# Patient Record
Sex: Female | Born: 1935 | Race: White | Hispanic: No | Marital: Single | Smoking: Never smoker
Health system: Southern US, Community
[De-identification: ages and names within clinical notes are randomized; demographics above are authoritative.]

## PROBLEM LIST (undated history)

## (undated) ENCOUNTER — Emergency Department (HOSPITAL_COMMUNITY): Discharge status: Absent without leave | Payer: Self-pay

## (undated) DIAGNOSIS — F0280 Dementia in other diseases classified elsewhere without behavioral disturbance: Secondary | ICD-10-CM

## (undated) DIAGNOSIS — N390 Urinary tract infection, site not specified: Secondary | ICD-10-CM

## (undated) DIAGNOSIS — D649 Anemia, unspecified: Secondary | ICD-10-CM

## (undated) DIAGNOSIS — N289 Disorder of kidney and ureter, unspecified: Secondary | ICD-10-CM

## (undated) DIAGNOSIS — G309 Alzheimer's disease, unspecified: Secondary | ICD-10-CM

## (undated) DIAGNOSIS — I10 Essential (primary) hypertension: Secondary | ICD-10-CM

## (undated) DIAGNOSIS — R1310 Dysphagia, unspecified: Secondary | ICD-10-CM

## (undated) DIAGNOSIS — K219 Gastro-esophageal reflux disease without esophagitis: Secondary | ICD-10-CM

## (undated) DIAGNOSIS — E119 Type 2 diabetes mellitus without complications: Secondary | ICD-10-CM

## (undated) DIAGNOSIS — M1990 Unspecified osteoarthritis, unspecified site: Secondary | ICD-10-CM

## (undated) DIAGNOSIS — F319 Bipolar disorder, unspecified: Secondary | ICD-10-CM

## (undated) HISTORY — PX: CHOLECYSTECTOMY: SHX55

---

## 2002-03-23 ENCOUNTER — Other Ambulatory Visit: Admission: RE | Admit: 2002-03-23 | Discharge: 2002-03-23 | Payer: Self-pay | Admitting: Obstetrics & Gynecology

## 2013-02-23 ENCOUNTER — Other Ambulatory Visit: Payer: Self-pay | Admitting: Geriatric Medicine

## 2013-02-23 MED ORDER — LORAZEPAM 0.5 MG PO TABS: 0.5000 mg | ORAL_TABLET | Freq: Two times a day (BID) | ORAL | Status: DC

## 2013-02-27 ENCOUNTER — Other Ambulatory Visit: Payer: Self-pay | Admitting: Geriatric Medicine

## 2013-02-27 MED ORDER — LORAZEPAM 0.5 MG PO TABS: 0.5000 mg | ORAL_TABLET | Freq: Two times a day (BID) | ORAL | Status: DC

## 2013-09-10 ENCOUNTER — Other Ambulatory Visit: Payer: Self-pay | Admitting: *Deleted

## 2013-09-10 MED ORDER — LORAZEPAM 0.5 MG PO TABS: ORAL_TABLET | ORAL | Status: DC

## 2013-09-10 NOTE — Telephone Encounter (Signed)
[facility] 

## 2014-03-22 ENCOUNTER — Other Ambulatory Visit: Payer: Self-pay | Admitting: *Deleted

## 2014-03-22 MED ORDER — LORAZEPAM 0.5 MG PO TABS: ORAL_TABLET | ORAL | Status: AC

## 2014-03-22 NOTE — Telephone Encounter (Signed)
[facility] 

## 2015-12-08 ENCOUNTER — Emergency Department (HOSPITAL_COMMUNITY): Payer: Medicare Other

## 2015-12-08 ENCOUNTER — Encounter (HOSPITAL_COMMUNITY): Payer: Self-pay

## 2015-12-08 ENCOUNTER — Inpatient Hospital Stay (HOSPITAL_COMMUNITY)
Admission: EM | Admit: 2015-12-08 | Discharge: 2015-12-12 | DRG: 690 | Disposition: A | Discharge status: Skilled Nursing Facility | Payer: Medicare Other | Attending: Family Medicine | Admitting: Family Medicine

## 2015-12-08 DIAGNOSIS — Z7401 Bed confinement status: Secondary | ICD-10-CM | POA: Diagnosis not present

## 2015-12-08 DIAGNOSIS — I82412 Acute embolism and thrombosis of left femoral vein: Secondary | ICD-10-CM | POA: Diagnosis present

## 2015-12-08 DIAGNOSIS — F0390 Unspecified dementia without behavioral disturbance: Secondary | ICD-10-CM | POA: Diagnosis present

## 2015-12-08 DIAGNOSIS — E119 Type 2 diabetes mellitus without complications: Secondary | ICD-10-CM | POA: Diagnosis present

## 2015-12-08 DIAGNOSIS — N390 Urinary tract infection, site not specified: Secondary | ICD-10-CM | POA: Diagnosis not present

## 2015-12-08 DIAGNOSIS — R310 Gross hematuria: Secondary | ICD-10-CM | POA: Diagnosis present

## 2015-12-08 DIAGNOSIS — I82429 Acute embolism and thrombosis of unspecified iliac vein: Secondary | ICD-10-CM | POA: Diagnosis present

## 2015-12-08 DIAGNOSIS — I82423 Acute embolism and thrombosis of iliac vein, bilateral: Secondary | ICD-10-CM

## 2015-12-08 DIAGNOSIS — Z66 Do not resuscitate: Secondary | ICD-10-CM | POA: Diagnosis present

## 2015-12-08 DIAGNOSIS — D62 Acute posthemorrhagic anemia: Secondary | ICD-10-CM | POA: Diagnosis present

## 2015-12-08 DIAGNOSIS — D649 Anemia, unspecified: Secondary | ICD-10-CM | POA: Diagnosis present

## 2015-12-08 DIAGNOSIS — K5641 Fecal impaction: Secondary | ICD-10-CM | POA: Diagnosis present

## 2015-12-08 DIAGNOSIS — R319 Hematuria, unspecified: Secondary | ICD-10-CM | POA: Diagnosis present

## 2015-12-08 DIAGNOSIS — G309 Alzheimer's disease, unspecified: Secondary | ICD-10-CM | POA: Diagnosis present

## 2015-12-08 DIAGNOSIS — F0280 Dementia in other diseases classified elsewhere without behavioral disturbance: Secondary | ICD-10-CM | POA: Diagnosis present

## 2015-12-08 DIAGNOSIS — K219 Gastro-esophageal reflux disease without esophagitis: Secondary | ICD-10-CM | POA: Diagnosis present

## 2015-12-08 DIAGNOSIS — N3289 Other specified disorders of bladder: Secondary | ICD-10-CM

## 2015-12-08 DIAGNOSIS — I82422 Acute embolism and thrombosis of left iliac vein: Secondary | ICD-10-CM | POA: Insufficient documentation

## 2015-12-08 DIAGNOSIS — E876 Hypokalemia: Secondary | ICD-10-CM | POA: Diagnosis present

## 2015-12-08 HISTORY — DX: Dementia in other diseases classified elsewhere, unspecified severity, without behavioral disturbance, psychotic disturbance, mood disturbance, and anxiety: F02.80

## 2015-12-08 HISTORY — DX: Disorder of kidney and ureter, unspecified: N28.9

## 2015-12-08 HISTORY — DX: Gastro-esophageal reflux disease without esophagitis: K21.9

## 2015-12-08 HISTORY — DX: Urinary tract infection, site not specified: N39.0

## 2015-12-08 HISTORY — DX: Unspecified osteoarthritis, unspecified site: M19.90

## 2015-12-08 HISTORY — DX: Essential (primary) hypertension: I10

## 2015-12-08 HISTORY — DX: Alzheimer's disease, unspecified: G30.9

## 2015-12-08 HISTORY — DX: Anemia, unspecified: D64.9

## 2015-12-08 HISTORY — DX: Type 2 diabetes mellitus without complications: E11.9

## 2015-12-08 HISTORY — DX: Bipolar disorder, unspecified: F31.9

## 2015-12-08 HISTORY — DX: Dysphagia, unspecified: R13.10

## 2015-12-08 LAB — CBC WITH DIFFERENTIAL/PLATELET
BASOS ABS: 0 10*3/uL (ref 0.0–0.1)
BASOS PCT: 0 %
EOS ABS: 0.3 10*3/uL (ref 0.0–0.7)
Eosinophils Relative: 5 %
HCT: 25 % — ABNORMAL LOW (ref 36.0–46.0)
HEMOGLOBIN: 8.4 g/dL — AB (ref 12.0–15.0)
Lymphocytes Relative: 23 %
Lymphs Abs: 1.4 10*3/uL (ref 0.7–4.0)
MCH: 32.1 pg (ref 26.0–34.0)
MCHC: 33.6 g/dL (ref 30.0–36.0)
MCV: 95.4 fL (ref 78.0–100.0)
Monocytes Absolute: 0.4 10*3/uL (ref 0.1–1.0)
Monocytes Relative: 6 %
NEUTROS PCT: 66 %
Neutro Abs: 4 10*3/uL (ref 1.7–7.7)
Platelets: 223 10*3/uL (ref 150–400)
RBC: 2.62 MIL/uL — AB (ref 3.87–5.11)
RDW: 12.8 % (ref 11.5–15.5)
WBC: 6.2 10*3/uL (ref 4.0–10.5)

## 2015-12-08 LAB — URINALYSIS, ROUTINE W REFLEX MICROSCOPIC
GLUCOSE, UA: 250 mg/dL — AB
KETONES UR: 15 mg/dL — AB
Nitrite: POSITIVE — AB
Specific Gravity, Urine: 1.01 (ref 1.005–1.030)
pH: 6.5 (ref 5.0–8.0)

## 2015-12-08 LAB — URINE MICROSCOPIC-ADD ON

## 2015-12-08 LAB — BASIC METABOLIC PANEL
Anion gap: 9 (ref 5–15)
BUN: 14 mg/dL (ref 6–20)
CHLORIDE: 100 mmol/L — AB (ref 101–111)
CO2: 26 mmol/L (ref 22–32)
CREATININE: 0.34 mg/dL — AB (ref 0.44–1.00)
Calcium: 9.1 mg/dL (ref 8.9–10.3)
GFR calc non Af Amer: >60 mL/min (ref >60)
Glucose, Bld: 107 mg/dL — ABNORMAL HIGH (ref 65–99)
POTASSIUM: 4.2 mmol/L (ref 3.5–5.1)
SODIUM: 135 mmol/L (ref 135–145)

## 2015-12-08 LAB — PROTIME-INR
INR: 1.1 (ref 0.00–1.49)
PROTHROMBIN TIME: 14.4 s (ref 11.6–15.2)

## 2015-12-08 MED ORDER — SENNA 8.6 MG PO TABS
1.0000 | ORAL_TABLET | Freq: Every day | ORAL | Status: DC
  Administered 2015-12-08 – 2015-12-11 (×2): 8.6 mg via ORAL
  Filled 2015-12-08: qty 1

## 2015-12-08 MED ORDER — SODIUM CHLORIDE 0.9 % IV SOLN: INTRAVENOUS | Status: DC

## 2015-12-08 MED ORDER — ACETAMINOPHEN 500 MG PO TABS: 500.0000 mg | ORAL_TABLET | Freq: Three times a day (TID) | ORAL | Status: DC | PRN

## 2015-12-08 MED ORDER — LATANOPROST 0.005 % OP SOLN
1.0000 [drp] | Freq: Every day | OPHTHALMIC | Status: DC
  Administered 2015-12-09 – 2015-12-11 (×3): 1 [drp] via OPHTHALMIC
  Filled 2015-12-08: qty 2.5

## 2015-12-08 MED ORDER — AMLODIPINE BESYLATE 5 MG PO TABS
5.0000 mg | ORAL_TABLET | Freq: Every day | ORAL | Status: DC
  Administered 2015-12-09 – 2015-12-12 (×4): 5 mg via ORAL
  Filled 2015-12-08 (×4): qty 1

## 2015-12-08 MED ORDER — ENALAPRIL MALEATE 5 MG PO TABS
5.0000 mg | ORAL_TABLET | Freq: Every day | ORAL | Status: DC
  Administered 2015-12-09 – 2015-12-12 (×4): 5 mg via ORAL
  Filled 2015-12-08 (×4): qty 1

## 2015-12-08 MED ORDER — RISPERIDONE 1 MG PO TABS
1.0000 mg | ORAL_TABLET | Freq: Every day | ORAL | Status: DC
  Administered 2015-12-08 – 2015-12-11 (×4): 1 mg via ORAL
  Filled 2015-12-08 (×4): qty 1

## 2015-12-08 MED ORDER — CARBAMAZEPINE 200 MG PO TABS
400.0000 mg | ORAL_TABLET | Freq: Two times a day (BID) | ORAL | Status: DC
  Administered 2015-12-08 – 2015-12-12 (×8): 400 mg via ORAL
  Filled 2015-12-08 (×8): qty 2

## 2015-12-08 MED ORDER — DEXTROSE 5 % IV SOLN
1.0000 g | INTRAVENOUS | Status: DC
  Administered 2015-12-08 – 2015-12-11 (×4): 1 g via INTRAVENOUS
  Filled 2015-12-08 (×5): qty 10

## 2015-12-08 MED ORDER — METFORMIN HCL 500 MG PO TABS
500.0000 mg | ORAL_TABLET | Freq: Two times a day (BID) | ORAL | Status: DC
  Administered 2015-12-09 (×2): 500 mg via ORAL
  Filled 2015-12-08 (×2): qty 1

## 2015-12-08 MED ORDER — ASPIRIN 81 MG PO CHEW
81.0000 mg | CHEWABLE_TABLET | Freq: Every day | ORAL | Status: DC
  Administered 2015-12-08 – 2015-12-09 (×2): 81 mg via ORAL
  Filled 2015-12-08 (×2): qty 1

## 2015-12-08 MED ORDER — ADULT MULTIVITAMIN W/MINERALS CH
1.0000 | ORAL_TABLET | Freq: Every day | ORAL | Status: DC
Start: 2015-12-08 — End: 2015-12-12
  Administered 2015-12-08 – 2015-12-12 (×5): 1 via ORAL
  Filled 2015-12-08 (×5): qty 1

## 2015-12-08 MED ORDER — ONDANSETRON HCL 4 MG/2ML IJ SOLN: 4.0000 mg | Freq: Four times a day (QID) | INTRAMUSCULAR | Status: DC | PRN

## 2015-12-08 MED ORDER — FAMOTIDINE 20 MG PO TABS
10.0000 mg | ORAL_TABLET | Freq: Every day | ORAL | Status: DC
  Administered 2015-12-08 – 2015-12-12 (×5): 10 mg via ORAL
  Filled 2015-12-08 (×5): qty 1

## 2015-12-08 MED ORDER — IOPAMIDOL (ISOVUE-300) INJECTION 61%
75.0000 mL | Freq: Once | INTRAVENOUS | Status: AC | PRN
  Administered 2015-12-08: 75 mL via INTRAVENOUS

## 2015-12-08 MED ORDER — PRO-STAT SUGAR FREE PO LIQD
30.0000 mL | Freq: Every day | ORAL | Status: DC
  Administered 2015-12-08 – 2015-12-12 (×5): 30 mL via ORAL
  Filled 2015-12-08 (×5): qty 30

## 2015-12-08 MED ORDER — FLEET ENEMA 7-19 GM/118ML RE ENEM
1.0000 | ENEMA | Freq: Once | RECTAL | Status: AC
  Administered 2015-12-08: 1 via RECTAL

## 2015-12-08 MED ORDER — ONDANSETRON HCL 4 MG PO TABS
4.0000 mg | ORAL_TABLET | Freq: Four times a day (QID) | ORAL | Status: DC | PRN
  Administered 2015-12-10: 4 mg via ORAL
  Filled 2015-12-08: qty 1

## 2015-12-08 MED ORDER — ATORVASTATIN CALCIUM 20 MG PO TABS
20.0000 mg | ORAL_TABLET | Freq: Every day | ORAL | Status: DC
  Administered 2015-12-09 – 2015-12-12 (×4): 20 mg via ORAL
  Filled 2015-12-08 (×3): qty 1
  Filled 2015-12-08: qty 2

## 2015-12-08 MED ORDER — DIATRIZOATE MEGLUMINE & SODIUM 66-10 % PO SOLN
ORAL | Status: AC
  Administered 2015-12-08: 15 mL
  Filled 2015-12-08: qty 30

## 2015-12-08 MED ORDER — DOCUSATE SODIUM 100 MG PO CAPS
100.0000 mg | ORAL_CAPSULE | Freq: Every day | ORAL | Status: DC
  Administered 2015-12-08 – 2015-12-09 (×2): 100 mg via ORAL
  Filled 2015-12-08 (×2): qty 1

## 2015-12-08 MED ORDER — OLOPATADINE HCL 0.1 % OP SOLN
1.0000 [drp] | Freq: Two times a day (BID) | OPHTHALMIC | Status: DC
  Administered 2015-12-09 – 2015-12-12 (×7): 1 [drp] via OPHTHALMIC
  Filled 2015-12-08: qty 5

## 2015-12-08 MED ORDER — SODIUM CHLORIDE 0.9 % IV SOLN
INTRAVENOUS | Status: AC
  Administered 2015-12-08: 21:00:00 via INTRAVENOUS

## 2015-12-08 MED ORDER — MIRTAZAPINE 15 MG PO TABS
15.0000 mg | ORAL_TABLET | Freq: Every day | ORAL | Status: DC
  Administered 2015-12-08 – 2015-12-11 (×4): 15 mg via ORAL
  Filled 2015-12-08 (×4): qty 1

## 2015-12-08 MED ORDER — LORAZEPAM 0.5 MG PO TABS
0.5000 mg | ORAL_TABLET | Freq: Two times a day (BID) | ORAL | Status: DC
  Administered 2015-12-08 – 2015-12-12 (×8): 0.5 mg via ORAL
  Filled 2015-12-08 (×8): qty 1

## 2015-12-08 NOTE — ED Notes (Signed)
[facility] hematuria

## 2015-12-08 NOTE — ED Provider Notes (Signed)
CSN: [id number]     Arrival date & time 12/08/15  1306 History   First MD Initiated Contact with Patient 12/08/15 1437     Chief Complaint  Patient presents with  . Hematuria      Patient is a 82 y.o. female presenting with hematuria. The history is provided by the EMS personnel, the nursing home and a relative. The history is limited by the condition of the patient (Hx dementia).  Hematuria  Pt was seen at 1450. Per EMS, NH report, and pt's family: Pt with gross hematuria through foley catheter for the past several days. NH states pt's Hgb dropped from "11" to "9" since yesterday, so she was sent to the ED for further evaluation. Pt herself has significant hx of dementia.   Past Medical History  Diagnosis Date  . Alzheimer's dementia uti  . UTI (urinary tract infection)   . Arthritis   . Renal disorder   . Dysphagia   . Anemia   . Gastro-esophageal reflux   . Diabetes mellitus without complication (HCC)   . Hypertension   . Bipolar 1 disorder [facility])    Past Surgical History  Procedure Laterality Date  . Cholecystectomy      Social History  Substance Use Topics  . Smoking status: Never Smoker   . Smokeless tobacco: None  . Alcohol Use: No    Review of Systems  Unable to perform ROS: Dementia  Genitourinary: Positive for hematuria.      Allergies  Review of patient's allergies indicates no known allergies.  Home Medications   Prior to Admission medications   Medication Sig Start Date End Date Taking? Authorizing Provider  acetaminophen (TYLENOL) 500 MG tablet Take 500 mg by mouth every 8 (eight) hours as needed (osteoarthritis).   Yes Historical Provider, MD  Amino Acids-Protein Hydrolys (FEEDING SUPPLEMENT, PRO-STAT SUGAR FREE 64,) LIQD Take 30 mLs by mouth daily.   Yes Historical Provider, MD  amLODipine (NORVASC) 5 MG tablet Take 5 mg by mouth daily.   Yes Historical Provider, MD  atorvastatin (LIPITOR) 20 MG tablet Take 20 mg by mouth daily.   Yes Historical  Provider, MD  bimatoprost (LUMIGAN) 0.01 % SOLN Place 1 drop into both eyes at bedtime.   Yes Historical Provider, MD  carbamazepine (TEGRETOL) 200 MG tablet Take 400 mg by mouth 2 (two) times daily.   Yes Historical Provider, MD  enalapril (VASOTEC) 5 MG tablet Take 5 mg by mouth daily.   Yes Historical Provider, MD  levofloxacin (LEVAQUIN) 500 MG tablet Take 500 mg by mouth daily. 12/06/15 12/12/15 Yes Historical Provider, MD  LORazepam (ATIVAN) 0.5 MG tablet Take one tablet by mouth twice daily 03/22/14  Yes Tiffany L Reed, DO  metFORMIN (GLUCOPHAGE) 500 MG tablet Take 500 mg by mouth 2 (two) times daily with a meal.   Yes Historical Provider, MD  mirtazapine (REMERON) 15 MG tablet Take 15 mg by mouth at bedtime.   Yes Historical Provider, MD  Multiple Vitamins-Minerals (MULTIVITAMIN WITH MINERALS) tablet Take 1 tablet by mouth daily.   Yes Historical Provider, MD  olopatadine (PATANOL) 0.1 % ophthalmic solution Place 1 drop into both eyes every 12 (twelve) hours.   Yes Historical Provider, MD  ranitidine (ZANTAC) 75 MG tablet Take 75 mg by mouth at bedtime.   Yes Historical Provider, MD  risperiDONE (RISPERDAL) 1 MG tablet Take 1 mg by mouth at bedtime.   Yes Historical Provider, MD  senna (SENOKOT) 8.6 MG TABS tablet Take 1 tablet by  mouth at bedtime.   Yes Historical Provider, MD  aspirin 81 MG chewable tablet Chew 81 mg by mouth daily.    Historical Provider, MD  docusate sodium (COLACE) 100 MG capsule Take 100 mg by mouth daily.    Historical Provider, MD   BP 114/95 mmHg  Pulse 69  Temp(Src) 97.8 F (36.6 C) (Oral)  Resp 17  SpO2 100% Physical Exam  1455: Physical examination:  Nursing notes reviewed; Vital signs and O2 SAT reviewed;  Constitutional: Well developed, Well nourished, Well hydrated, In no acute distress; Head:  Normocephalic, atraumatic; Eyes: EOMI, PERRL, No scleral icterus; ENMT: Mouth and pharynx normal, Mucous membranes moist; Neck: Supple, Full range of motion, No  lymphadenopathy; Cardiovascular: Regular rate and rhythm, No gallop; Respiratory: Breath sounds clear & equal bilaterally, No wheezes.  Speaking full sentences with ease, Normal respiratory effort/excursion; Chest: Nontender, Movement normal; Abdomen: Soft, Nontender, Nondistended, Normal bowel sounds; Genitourinary: No CVA tenderness. +gross hematuria in foley tubing and bag.; Extremities: Pulses normal, No tenderness, No edema, No calf edema or asymmetry.; Neuro: Awake, alert, confused per hx dementia. Speech clear. Moves all extremities on stretcher spontaneously.; Skin: Color normal, Warm, Dry.   ED Course  Procedures (including critical care time) Labs Review   Imaging Review  I have personally reviewed and evaluated these images and lab results as part of my medical decision-making.   EKG Interpretation None      MDM  MDM Reviewed: previous chart, nursing note and vitals Reviewed previous: labs Interpretation: labs and CT scan     Results for orders placed or performed during the hospital encounter of 12/08/15  CBC with Differential  Result Value Ref Range   WBC 6.2 4.0 - 10.5 K/uL   RBC 2.62 (L) 3.87 - 5.11 MIL/uL   Hemoglobin 8.4 (L) 12.0 - 15.0 g/dL   HCT 40.925.0 (L) 81.136.0 - 91.446.0 %   MCV 95.4 78.0 - 100.0 fL   MCH 32.1 26.0 - 34.0 pg   MCHC 33.6 30.0 - 36.0 g/dL   RDW 78.212.8 95.611.5 - 21.315.5 %   Platelets 223 150 - 400 K/uL   Neutrophils Relative % 66 %   Neutro Abs 4.0 1.7 - 7.7 K/uL   Lymphocytes Relative 23 %   Lymphs Abs 1.4 0.7 - 4.0 K/uL   Monocytes Relative 6 %   Monocytes Absolute 0.4 0.1 - 1.0 K/uL   Eosinophils Relative 5 %   Eosinophils Absolute 0.3 0.0 - 0.7 K/uL   Basophils Relative 0 %   Basophils Absolute 0.0 0.0 - 0.1 K/uL  Basic metabolic panel  Result Value Ref Range   Sodium 135 135 - 145 mmol/L   Potassium 4.2 3.5 - 5.1 mmol/L   Chloride 100 (L) 101 - 111 mmol/L   CO2 26 22 - 32 mmol/L   Glucose, Bld 107 (H) 65 - 99 mg/dL   BUN 14 6 - 20 mg/dL    Creatinine, Ser 0.860.34 (L) 0.44 - 1.00 mg/dL   Calcium 9.1 8.9 - 57.810.3 mg/dL   GFR calc non Af Amer >60 >60 mL/min   GFR calc Af Amer >60 >60 mL/min   Anion gap 9 5 - 15  Urinalysis, Routine w reflex microscopic  Result Value Ref Range   Color, Urine RED (A) YELLOW   APPearance CLOUDY (A) CLEAR   Specific Gravity, Urine 1.010 1.005 - 1.030   pH 6.5 5.0 - 8.0   Glucose, UA 250 (A) NEGATIVE mg/dL   Hgb urine dipstick LARGE (A)  NEGATIVE   Bilirubin Urine MODERATE (A) NEGATIVE   Ketones, ur 15 (A) NEGATIVE mg/dL   Protein, ur >161 (A) NEGATIVE mg/dL   Nitrite POSITIVE (A) NEGATIVE   Leukocytes, UA MODERATE (A) NEGATIVE  Protime-INR  Result Value Ref Range   Prothrombin Time 14.4 11.6 - 15.2 seconds   INR 1.10 0.00 - 1.49  Urine microscopic-add on  Result Value Ref Range   Squamous Epithelial / LPF 0-5 (A) NONE SEEN   WBC, UA 6-30 0 - 5 WBC/hpf   RBC / HPF TOO NUMEROUS TO COUNT 0 - 5 RBC/hpf   Bacteria, UA MANY (A) NONE SEEN  Sample to Blood Bank  Result Value Ref Range   Blood Bank Specimen SAMPLE AVAILABLE FOR TESTING    Sample Expiration 12/11/2015    Ct Abdomen Pelvis W Contrast 12/08/2015  CLINICAL DATA:  Hematuria.  Dementia. EXAM: CT ABDOMEN AND PELVIS WITH CONTRAST TECHNIQUE: Multidetector CT imaging of the abdomen and pelvis was performed using the standard protocol following bolus administration of intravenous contrast. CONTRAST:  75mL ISOVUE-300 IOPAMIDOL (ISOVUE-300) INJECTION 61% COMPARISON:  None. FINDINGS: Lower chest and abdominal wall:  Atelectasis in the lower lobes. Hepatobiliary: Upper right liver cyst with simple appearance.Cholecystectomy with normal common bile duct. Pancreas: Unremarkable. Spleen: Unremarkable. Adrenals/Urinary Tract: Negative adrenals. Fullness of the bilateral urinary collecting system which is likely from poor bladder drainage. Left hilar calcification appears vascular on reformats. No urolithiasis suspected. Simple right renal cyst. Bladder is  partially decompressed by a well-positioned Foley catheter. Lumen contains heterogeneous high density material consistent with clot. Diffuse bladder wall thickening without gross bladder wall mass, although limited by the degree of hematoma. No enhancement seen along the upper urothelium. Reproductive:Probable atrophic uterus which is deviated to the left. Stomach/Bowel: The rectum is distended by stool to 10 cm. There is congestion in the mesial rectal fat and mild wall thickening. Large stool burden in the left colon. No small bowel obstruction. Negative appendix. Vascular/Lymphatic: Although early in the venous phase there is asymmetric low-density appearance in the left common and external iliac artery, patchy in the common femoral artery. There is asymmetric left lower extremity edema seen at the thigh. Findings are convincing for DVT, chronicity indeterminate due to lack of vessel expansion. No mass or adenopathy. Peritoneal: No ascites or pneumoperitoneum. Musculoskeletal: No acute abnormalities. IMPRESSION: 1. Moderate volume blood clot in the otherwise decompressed bladder. Nonspecific bladder wall thickening without gross mass lesion. No upper tract source of hematuria identified. 2. Rectal impaction with developing stercoral colitis. 3. Left iliac DVT. Electronically Signed   By: Marnee Spring M.D.   On: 12/08/2015 17:18    1755:  Will need Uro MD consult, serial H/H; will admit. +UTI, UC pending; will dose IV rocephin. Dx and testing d/w pt and family.  Questions answered.  Verb understanding, agreeable to admit. T/C to Uro Dr. Ronne Binning, case discussed, including:  HPI, pertinent PM/SHx, VS/PE, dx testing, ED course and treatment:  Agreeable to consult, agrees with IV rocephin, requests to start CBI.  T/C to [facility] Dr. Arlean Hopping, case discussed, including:  HPI, pertinent PM/SHx, VS/PE, dx testing, ED course and treatment:  Agreeable to admit, requests to write temporary orders, obtain medical bed to  team APAdmits.   Samuel Jester, DO 12/12/15 1635

## 2015-12-08 NOTE — H&P (Addendum)
[facility] History and Physical  Melanie AlimentDoris Hutcherson WUJ:[id number] DOB: 1932/12/06 DOA: 12/08/2015  Referring physician: Dr. Clarene DukeMcManus PCP: No primary care provider on file.   Chief Complaint: Hematuria  HPI: [facility] is a 82 y.o. female with history of DM2, HTN, dementia and bipolar disorder.  Patient has lived in SNF for about 12 yrs, is basically bedridden with history of fecal impaction in the past per the daughter. [facility]. Dtr provides the history.  Two days ago had blood noted in her diaper, then developed gross hematuria and foley was put in.  Hb followed and today dropped to 7.2, so sent to ED. Here Hb is 8.2.  CT abd shows fecal impaction w some early proctitis and clot in the bladder, incidental L iliac DVT.  ED MD discussed with urology who recommended CBI overnight and they will see her tomorrow.  Given rocephin in ED for possible UTI.    Patient is awake and alert, not responding to simple questions but does track.     ROS  n/a  Where does patient live SNF Can patient participate in ADLs? no  Past Medical History  Past Medical History  Diagnosis Date  . Alzheimer's dementia uti  . UTI (urinary tract infection)   . Arthritis   . Renal disorder   . Dysphagia   . Anemia   . Gastro-esophageal reflux   . Diabetes mellitus without complication (HCC)   . Hypertension   . Bipolar 1 disorder [facility](HCC)    Past Surgical History  Past Surgical History  Procedure Laterality Date  . Cholecystectomy     Family History No family history on file. Social History  reports that she has never smoked. She does not have any smokeless tobacco history on file. She reports that she does not drink alcohol or use illicit drugs. Allergies No Known Allergies Home medications Prior to Admission medications   Medication Sig Start Date End Date Taking? Authorizing Provider  acetaminophen (TYLENOL) 500 MG tablet Take 500 mg by mouth every 8 (eight) hours as needed  (osteoarthritis).   Yes Historical Provider, MD  Amino Acids-Protein Hydrolys (FEEDING SUPPLEMENT, PRO-STAT SUGAR FREE 64,) LIQD Take 30 mLs by mouth daily.   Yes Historical Provider, MD  amLODipine (NORVASC) 5 MG tablet Take 5 mg by mouth daily.   Yes Historical Provider, MD  atorvastatin (LIPITOR) 20 MG tablet Take 20 mg by mouth daily.   Yes Historical Provider, MD  bimatoprost (LUMIGAN) 0.01 % SOLN Place 1 drop into both eyes at bedtime.   Yes Historical Provider, MD  carbamazepine (TEGRETOL) 200 MG tablet Take 400 mg by mouth 2 (two) times daily.   Yes Historical Provider, MD  enalapril (VASOTEC) 5 MG tablet Take 5 mg by mouth daily.   Yes Historical Provider, MD  levofloxacin (LEVAQUIN) 500 MG tablet Take 500 mg by mouth daily. 12/06/15 12/12/15 Yes Historical Provider, MD  LORazepam (ATIVAN) 0.5 MG tablet Take one tablet by mouth twice daily 03/22/14  Yes Tiffany L Reed, DO  metFORMIN (GLUCOPHAGE) 500 MG tablet Take 500 mg by mouth 2 (two) times daily with a meal.   Yes Historical Provider, MD  mirtazapine (REMERON) 15 MG tablet Take 15 mg by mouth at bedtime.   Yes Historical Provider, MD  Multiple Vitamins-Minerals (MULTIVITAMIN WITH MINERALS) tablet Take 1 tablet by mouth daily.   Yes Historical Provider, MD  olopatadine (PATANOL) 0.1 % ophthalmic solution Place 1 drop into both eyes every 12 (twelve) hours.  Yes Historical Provider, MD  ranitidine (ZANTAC) 75 MG tablet Take 75 mg by mouth at bedtime.   Yes Historical Provider, MD  risperiDONE (RISPERDAL) 1 MG tablet Take 1 mg by mouth at bedtime.   Yes Historical Provider, MD  senna (SENOKOT) 8.6 MG TABS tablet Take 1 tablet by mouth at bedtime.   Yes Historical Provider, MD  aspirin 81 MG chewable tablet Chew 81 mg by mouth daily.    Historical Provider, MD  docusate sodium (COLACE) 100 MG capsule Take 100 mg by mouth daily.    Historical Provider, MD   Liver Function Tests No results for input(s): AST, ALT, ALKPHOS, BILITOT, PROT,  ALBUMIN in the last 168 hours. No results for input(s): LIPASE, AMYLASE in the last 168 hours. CBC  Recent Labs Lab 12/08/15 1435  WBC 6.2  NEUTROABS 4.0  HGB 8.4*  HCT 25.0*  MCV 95.4  PLT 223   Basic Metabolic Panel  Recent Labs Lab 12/08/15 1435  NA 135  K 4.2  CL 100*  CO2 26  GLUCOSE 107*  BUN 14  CREATININE 0.34*  CALCIUM 9.1     Filed Vitals:   12/08/15 1554 12/08/15 1600 12/08/15 1630 12/08/15 1923  BP: 100/81 114/95 103/60 130/96  Pulse: 95 69 87 89  Temp:    98.3 F (36.8 C)  TempSrc:    Oral  Resp: 17     SpO2: 99% 100% 100% 100%   Exam: VS: BP low on arrival, now up after IVF BP 130/96  HR 85   RR 16  afeb Gen alert, tongue/lip smacking, speaking to dtr but not intelligible No rash, cyanosis or gangrene Sclera anicteric, throat clear  No jvd or bruits Chest clear bilat RRR no MRG Abd soft ntnd no mass or ascites +bs obese GU foley draining dark red urine in the bag and lighter red urine in the tube MS no joint effusions or deformity Ext no LE edema / no wounds or ulcers Neuro is alert, doesn't move legs much, moves arms symmetrically   Home medications >>  Norvasc, vasotec Tegretol, ativan, remeron, risperdal Eye drops Lipitor, metformin MVI, zantac, senna, asa, colace  WBC 6k  Hb 8.4  plt 223   Na 135  K 4.2  CO2 26  BUN 14   Creat 0.34  Glu 107   Assessment: 1. Gross hematuria/ bladder clot by CT - unclear cause, plan CBI and urology consult tomorrow (called by ED MD).  2. Fecal impaction - recurrent issues, some stercoral proctitis on CT.  Plan disimpact, Fleet's enema 3. Dementia 4. Chron debility/ bedridden 5. L iliac DVT - incidental on CT scan, no leg edema or signs of outflow obstruction.  Not a good candidate for anticoagulation right now due to #1. Have d/w daughter risks outweigh benefit. Hold off on heparin, get doppler LLE.  6. HTN cont meds 7. DM2 use SSI for now + metformin 8. Psych - cont remeron/  risperdal 9. Possible UTI - on Rocephin  Plan - as above   DVT Prophylaxis SCD  Code Status: DNR per daughter  Family Communication: at bedside  Disposition Plan: home when better    Maree Krabbe [facility] Pager [phone]  Cell [phone]  If 7PM-7AM, please contact night-coverage [email or web address] Password [facility] 12/08/2015, 8:23 PM

## 2015-12-08 NOTE — Progress Notes (Signed)
Pharmacy Antibiotic Note  Melanie AlimentDoris Kitchens is a 82 y.o. female admitted on 12/08/2015 with UTI.  Pharmacy has been consulted for rocephin dosing.  Plan: Rocephin 1gm IV q24 hours F/u cultures and clinical course     Temp (24hrs), Avg:97.8 F (36.6 C), Min:97.8 F (36.6 C), Max:97.8 F (36.6 C)   Recent Labs Lab 12/08/15 1435  WBC 6.2  CREATININE 0.34*    CrCl cannot be calculated (Unknown ideal weight.).    No Known Allergies  Antimicrobials this admission: 5/11 rocephin >>    Thank you for allowing pharmacy to be a part of this patient's care.  Talbert CageSeay, Eiliana Drone Poteet 12/08/2015 6:20 PM

## 2015-12-09 DIAGNOSIS — R310 Gross hematuria: Secondary | ICD-10-CM

## 2015-12-09 DIAGNOSIS — F0390 Unspecified dementia without behavioral disturbance: Secondary | ICD-10-CM | POA: Diagnosis not present

## 2015-12-09 DIAGNOSIS — I82422 Acute embolism and thrombosis of left iliac vein: Secondary | ICD-10-CM | POA: Diagnosis not present

## 2015-12-09 DIAGNOSIS — N390 Urinary tract infection, site not specified: Secondary | ICD-10-CM

## 2015-12-09 DIAGNOSIS — K5641 Fecal impaction: Secondary | ICD-10-CM | POA: Diagnosis not present

## 2015-12-09 DIAGNOSIS — N3289 Other specified disorders of bladder: Secondary | ICD-10-CM

## 2015-12-09 DIAGNOSIS — D62 Acute posthemorrhagic anemia: Secondary | ICD-10-CM | POA: Diagnosis not present

## 2015-12-09 DIAGNOSIS — A419 Sepsis, unspecified organism: Secondary | ICD-10-CM

## 2015-12-09 LAB — CBC
HEMATOCRIT: 25.6 % — AB (ref 36.0–46.0)
HEMOGLOBIN: 8.8 g/dL — AB (ref 12.0–15.0)
MCH: 33 pg (ref 26.0–34.0)
MCHC: 34.4 g/dL (ref 30.0–36.0)
MCV: 95.9 fL (ref 78.0–100.0)
Platelets: 228 10*3/uL (ref 150–400)
RBC: 2.67 MIL/uL — ABNORMAL LOW (ref 3.87–5.11)
RDW: 12.9 % (ref 11.5–15.5)
WBC: 7.4 10*3/uL (ref 4.0–10.5)

## 2015-12-09 LAB — BASIC METABOLIC PANEL
ANION GAP: 9 (ref 5–15)
BUN: 11 mg/dL (ref 6–20)
CO2: 28 mmol/L (ref 22–32)
Calcium: 9.1 mg/dL (ref 8.9–10.3)
Chloride: 98 mmol/L — ABNORMAL LOW (ref 101–111)
Creatinine, Ser: 0.32 mg/dL — ABNORMAL LOW (ref 0.44–1.00)
GFR calc Af Amer: >60 mL/min (ref >60)
GFR calc non Af Amer: >60 mL/min (ref >60)
GLUCOSE: 134 mg/dL — AB (ref 65–99)
POTASSIUM: 3.2 mmol/L — AB (ref 3.5–5.1)
Sodium: 135 mmol/L (ref 135–145)

## 2015-12-09 LAB — MRSA PCR SCREENING: MRSA by PCR: NEGATIVE

## 2015-12-09 MED ORDER — INSULIN ASPART 100 UNIT/ML ~~LOC~~ SOLN
0.0000 [IU] | Freq: Three times a day (TID) | SUBCUTANEOUS | Status: DC
  Administered 2015-12-10: 3 [IU] via SUBCUTANEOUS
  Administered 2015-12-10: 2 [IU] via SUBCUTANEOUS
  Administered 2015-12-10 – 2015-12-12 (×5): 1 [IU] via SUBCUTANEOUS
  Administered 2015-12-12 (×2): 2 [IU] via SUBCUTANEOUS

## 2015-12-09 MED ORDER — POLYETHYLENE GLYCOL 3350 17 G PO PACK
17.0000 g | PACK | Freq: Two times a day (BID) | ORAL | Status: DC
  Administered 2015-12-10 – 2015-12-12 (×3): 17 g via ORAL
  Filled 2015-12-09 (×3): qty 1

## 2015-12-09 MED ORDER — HEPARIN BOLUS VIA INFUSION
2500.0000 [IU] | Freq: Once | INTRAVENOUS | Status: AC
  Administered 2015-12-09: 2500 [IU] via INTRAVENOUS
  Filled 2015-12-09: qty 2500

## 2015-12-09 MED ORDER — MILK AND MOLASSES ENEMA
1.0000 | Freq: Once | RECTAL | Status: AC
  Administered 2015-12-09: 250 mL via RECTAL

## 2015-12-09 MED ORDER — HEPARIN (PORCINE) IN NACL 100-0.45 UNIT/ML-% IJ SOLN
1200.0000 [IU]/h | INTRAMUSCULAR | Status: DC
  Administered 2015-12-09: 850 [IU]/h via INTRAVENOUS
  Administered 2015-12-10 – 2015-12-11 (×2): 1050 [IU]/h via INTRAVENOUS
  Filled 2015-12-09 (×3): qty 250

## 2015-12-09 NOTE — Care Management Obs Status (Signed)
MEDICARE OBSERVATION STATUS NOTIFICATION   Patient Details  Name: Melanie AlimentDoris Iams MRN: [id number] Date of Birth: February 09, 1933   Medicare Observation Status Notification Given:  Yes    Malcolm MetroChildress, Catori Panozzo Demske, RN 12/09/2015, 1:21 PM

## 2015-12-09 NOTE — Clinical Social Work Note (Signed)
Clinical Social Work Assessment  Patient Details  Name: Melanie Baker MRN: [id number] Date of Birth: 1933/03/31  Date of referral:  12/09/15               Reason for consult:  Discharge Planning                Permission sought to share information with:    Permission granted to share information::     Name::        Agency::     Relationship::     Contact Information:     Housing/Transportation Living arrangements for the past 2 months:  [place] of Information:  Adult Children Patient Interpreter Needed:  None Criminal Activity/Legal Involvement Pertinent to Current Situation/Hospitalization:  No - Comment as needed Significant Relationships:  Adult Children Lives with:  Facility Resident Do you feel safe going back to the place where you live?  Yes Need for family participation in patient care:  Yes (Comment)  Care giving concerns:  None reported. Pt is long term resident resident at [facility].    Social Worker assessment / plan:  CSW met with pt's daughter, [facility] at bedside. Pt alert, but oriented to self only. Pt has been a resident at [facility] since [facility] and is now on memory care unit. [facility] reports she lives in [facility] and visits 1-2 times a week. She shared that she is considering moving pt to a facility closer to her at some point. CSW discussed this process and provided SNF list. Per Melanie Baker at facility, pt is okay to return and is aware of anticipated d/c this weekend. Pt has been requiring a lift for transfers.   Employment status:  Retired Forensic scientist:    PT Recommendations:  Not assessed at this time Information / Referral to community resources:  Other (Comment Required), [facility] (return to [facility])  Patient/Family's Response to care:  Pt's daughter is agreeable to return to [facility] when medically stable.   Patient/Family's Understanding of and Emotional Response to Diagnosis, Current Treatment, and  Prognosis:  Pt's daughter is aware of admission diagnosis and is waiting on urology input.   Emotional Assessment Appearance:  Appears stated age Attitude/Demeanor/Rapport:  Unable to Assess Affect (typically observed):  Unable to Assess Orientation:  Oriented to Self Alcohol / Substance use:  Not Applicable Psych involvement (Current and /or in the community):  No (Comment)  Discharge Needs  Concerns to be addressed:  Discharge Planning Concerns Readmission within the last 30 days:  No Current discharge risk:  Cognitively Impaired Barriers to Discharge:  Continued Medical Work up   Melanie [facility], [facility], [facility] 12/09/2015, 1:24 PM [phone]

## 2015-12-09 NOTE — NC FL2 (Signed)
[place] MEDICAID FL2 LEVEL OF CARE SCREENING TOOL     IDENTIFICATION  Patient Name: Melanie Baker Birthdate: Sep 08, 1932 Sex: female Admission Date (Current Location): 12/08/2015  [place]:  [place] [id number] N Facility and Address:  [facility],  [address], [place] [id number]      Provider Number: [id number]  Attending Physician Name and Address:  Standley Brookinganiel P Goodrich, MD  Relative Name and Phone Number:       Current Level of Care: Hospital Recommended Level of Care: Skilled Nursing Facility Prior Approval Number:    Date Approved/Denied:   PASRR Number: [id number]  Discharge Plan: SNF    Current Diagnoses: Patient Active Problem List   Diagnosis Date Noted  . Gross hematuria 12/08/2015  . Fecal impaction (HCC) 12/08/2015  . Dementia 12/08/2015  . Bedridden 12/08/2015  . Iliac DVT (deep venous thrombosis) (HCC) 12/08/2015  . Anemia 12/08/2015  . Absolute anemia   . Blood clot in bladder   . Deep vein thrombosis (DVT) of iliac vein of left lower extremity (HCC)   . Urinary tract infectious disease     Orientation RESPIRATION BLADDER Height & Weight     Self  Normal Indwelling catheter Weight: 121 lb 4.8 oz (55.021 kg) Height:  5\' 7"  (170.2 cm)  BEHAVIORAL SYMPTOMS/MOOD NEUROLOGICAL BOWEL NUTRITION STATUS     (n/a) Incontinent Diet (soft diet)  AMBULATORY STATUS COMMUNICATION OF NEEDS Skin   Total Care Verbally Other (Comment) (Moisture associated skin damage to sacrum with foam dressing)                       Personal Care Assistance Level of Assistance  Bathing, Feeding, Dressing Bathing Assistance: Maximum assistance Feeding assistance: Maximum assistance Dressing Assistance: Maximum assistance     Functional Limitations Info  Sight, Hearing, Speech Sight Info: Adequate Hearing Info: Adequate Speech Info: Adequate    SPECIAL CARE FACTORS FREQUENCY                       Contractures       Additional Factors Info  Code Status, Allergies, Psychotropic Code Status Info: DNR Allergies Info: No known allergies Psychotropic Info: Risperdal, Ativan         Current Medications (12/09/2015):  This is the current hospital active medication list Current Facility-Administered Medications  Medication Dose Route Frequency Provider Last Rate Last Dose  . 0.9 %  sodium chloride infusion   Intravenous Continuous Samuel JesterKathleen McManus, DO      . acetaminophen (TYLENOL) tablet 500 mg  500 mg Oral Q8H PRN Delano Metzobert Schertz, MD      . amLODipine (NORVASC) tablet 5 mg  5 mg Oral Daily Delano Metzobert Schertz, MD   5 mg at 12/09/15 1012  . aspirin chewable tablet 81 mg  81 mg Oral Daily Delano Metzobert Schertz, MD   81 mg at 12/09/15 1013  . atorvastatin (LIPITOR) tablet 20 mg  20 mg Oral q1800 Delano Metzobert Schertz, MD      . carbamazepine (TEGRETOL) tablet 400 mg  400 mg Oral BID Delano Metzobert Schertz, MD   400 mg at 12/09/15 1012  . cefTRIAXone (ROCEPHIN) 1 g in dextrose 5 % 50 mL IVPB  1 g Intravenous Q24H Samuel JesterKathleen McManus, DO   Stopped at 12/08/15 2006  . docusate sodium (COLACE) capsule 100 mg  100 mg Oral Daily Delano Metzobert Schertz, MD   100 mg at 12/09/15 1012  . enalapril (VASOTEC) tablet 5 mg  5 mg Oral Daily  Delano Metz, MD   5 mg at 12/09/15 1013  . famotidine (PEPCID) tablet 10 mg  10 mg Oral Daily Delano Metz, MD   10 mg at 12/09/15 1013  . feeding supplement (PRO-STAT SUGAR FREE 64) liquid 30 mL  30 mL Oral Daily Delano Metz, MD   30 mL at 12/09/15 1012  . latanoprost (XALATAN) 0.005 % ophthalmic solution 1 drop  1 drop Both Eyes QHS Delano Metz, MD   1 drop at 12/08/15 2200  . LORazepam (ATIVAN) tablet 0.5 mg  0.5 mg Oral BID Delano Metz, MD   0.5 mg at 12/09/15 1012  . metFORMIN (GLUCOPHAGE) tablet 500 mg  500 mg Oral BID WC Delano Metz, MD   500 mg at 12/09/15 1012  . mirtazapine (REMERON) tablet 15 mg  15 mg Oral QHS Delano Metz, MD   15 mg at 12/08/15 2301  . multivitamin with minerals tablet 1  tablet  1 tablet Oral Daily Delano Metz, MD   1 tablet at 12/09/15 1013  . olopatadine (PATANOL) 0.1 % ophthalmic solution 1 drop  1 drop Both Eyes Q12H Delano Metz, MD   1 drop at 12/09/15 1026  . ondansetron (ZOFRAN) tablet 4 mg  4 mg Oral Q6H PRN Delano Metz, MD       Or  . ondansetron [facility]) injection 4 mg  4 mg Intravenous Q6H PRN Delano Metz, MD      . risperiDONE (RISPERDAL) tablet 1 mg  1 mg Oral QHS Delano Metz, MD   1 mg at 12/08/15 2301  . senna (SENOKOT) tablet 8.6 mg  1 tablet Oral QHS Delano Metz, MD   8.6 mg at 12/08/15 2300     Discharge Medications: Please see discharge summary for a list of discharge medications.  Relevant Imaging Results:  Relevant Lab Results:   Additional Information SS#: [id number]  [place], [place] [phone]

## 2015-12-09 NOTE — Progress Notes (Signed)
PROGRESS NOTE  Melanie Baker Baker ZOX:[id number] DOB: 24-Oct-1932 DOA: 12/08/2015 PCP: No primary care provider on file.  Brief Narrative: 82 year old woman with history of advanced dementia, bedridden presented from skilled nursing facility with gross hematuria after insertion of a Foley and acute blood loss anemia. She was admitted for continuous bladder irrigation and urology evaluation. Also noted to have DVT.  Assessment/Plan: 1. Gross hematuria. Discussed with urology, felt to be secondary to infection. Hemoglobin stable thus far. Urology recommends continuing irrigation and will continue to follow. 2. UTI. Continue Rocephin pending culture result. 3. Fecal impaction, recurrent. Disimpaction, enema, bowel regimen. 4. Left iliac DVT. Follow-up bilateral lower extremity Doppler. Heparin per urology. 5. Advanced dementia. 6. Bedridden. 7. DM Type 2. Stable.    Continue empiric antibiotics, bladder irrigation, repeat CBC in the morning. Follow-up urology recommendations.  Discussed with urology Dr. Ronne BinningMcKenzie, okay to start anticoagulation with heparin.  Anticipate discharge next 48 hours if urine has cleared and hemoglobin is stable.  DVT prophylaxis:SCD Code Status: DNR Family Communication: No family bedside Disposition Plan: Anticipate discharge in 24 hours.   Brendia Sacksaniel Goodrich, MD  [facility] Direct contact:  --Via amion app OR  --[email or web address]; password [email or web address] and click  7PM-7AM contact night coverage as above 12/09/2015, 5:29 PM    Consultants:    Procedures:  None  Antimicrobials:  Ceftriaxone 5/11>>  HPI/Subjective: History limited secondary to dementia.  Objective: Filed Vitals:   12/08/15 2033 12/09/15 0342 12/09/15 0527 12/09/15 1355  BP: 127/61  151/75 104/45  Pulse: 90  106 80  Temp: 98 F (36.7 C)  98 F (36.7 C) 98.2 F (36.8 C)  TempSrc: Oral  Oral Oral  Resp: 18  20 20   Height: 5\' 7"  (1.702 m)     Weight: 55.067 kg (121 lb 6.4 oz) 55.68 kg  (122 lb 12 oz) 55.021 kg (121 lb 4.8 oz)   SpO2: 100%  97% 98%    Intake/Output Summary (Last 24 hours) at 12/09/15 1729 Last data filed at 12/09/15 1624  Gross per 24 hour  Intake  8119118280 ml  Output  48300 ml  Net -30020 ml     Filed Weights   12/08/15 2033 12/09/15 0342 12/09/15 0527  Weight: 55.067 kg (121 lb 6.4 oz) 55.68 kg (122 lb 12 oz) 55.021 kg (121 lb 4.8 oz)    Exam: Constitutional:  . Appears calm and comfortable  Respiratory:  . CTA bilaterally, no w/r/r.  . Respiratory effort normal. No retractions or accessory muscle use Cardiovascular:  . RRR, no m/r/g . TraceBilateral LE extremity edema   Abdomen:  . Abdomen appears normal; no tenderness or masses . Urine clear yellow in foley cath , currently on irrigation Skin:  . Well healed ulcer over left 1st proximal phalanx Psychiatric:  o Can not assess  I have personally reviewed following labs and imaging studies:  Potassium 3.2  BMP otherwise unremarkable  Hgb 8.8, stable  CT noted  Scheduled Meds: . amLODipine  5 mg Oral Daily  . atorvastatin  20 mg Oral q1800  . carbamazepine  400 mg Oral BID  . cefTRIAXone (ROCEPHIN)  IV  1 g Intravenous Q24H  . docusate sodium  100 mg Oral Daily  . enalapril  5 mg Oral Daily  . famotidine  10 mg Oral Daily  . feeding supplement (PRO-STAT SUGAR FREE 64)  30 mL Oral Daily  . latanoprost  1 drop Both Eyes QHS  . LORazepam  0.5 mg Oral BID  . mirtazapine  15  mg Oral QHS  . multivitamin with minerals  1 tablet Oral Daily  . olopatadine  1 drop Both Eyes Q12H  . risperiDONE  1 mg Oral QHS  . senna  1 tablet Oral QHS   Continuous Infusions:    Principal Problem:   Gross hematuria Active Problems:   Fecal impaction (HCC)   Dementia   Bedridden   Iliac DVT (deep venous thrombosis) (HCC)   Anemia   Blood clot in bladder   Deep vein thrombosis (DVT) of iliac vein of left lower extremity (HCC)   Urinary tract infectious disease     Time spent 25  minutes   By signing my name below, I, Zadie Cleverly attest that this documentation has been prepared under the direction and in the presence of Brendia Sacks, MD Electronically signed: Zadie Cleverly  12/09/2015  1:05pm   I personally performed the services described in this documentation. All medical record entries made by the scribe were at my direction. I have reviewed the chart and agree that the record reflects my personal performance and is accurate and complete. Brendia Sacks, MD

## 2015-12-09 NOTE — Care Management Note (Signed)
Case Management Note  Patient Details  Name: Melanie AlimentDoris Baker MRN: [id number] Date of Birth: [birth year]  Subjective/Objective:                  Pt admitted with hematuria. [facility] daughter is at the bedside. Plan is for pt to return to SNF at DC. CSW is aware of DC plan and will make arrangements for return to facility.   Action/Plan: No CM needs.   Expected Discharge Date:  12/12/15               Expected Discharge Plan:  Skilled Nursing Facility  In-House Referral:  Clinical Social Work  Discharge planning Services  CM Consult  Post Acute Care Choice:  NA Choice offered to:  NA  DME Arranged:    DME Agency:     HH Arranged:    HH Agency:     Status of Service:  Completed, signed off  Medicare Important Message Given:    Date Medicare IM Given:    Medicare IM give by:    Date Additional Medicare IM Given:    Additional Medicare Important Message give by:     If discussed at Long Length of Stay Meetings, dates discussed:    Additional Comments:  Malcolm MetroChildress, Kelsie Kramp Demske, RN 12/09/2015, 1:21 PM

## 2015-12-09 NOTE — Progress Notes (Signed)
ANTICOAGULATION CONSULT NOTE - Initial Consult  Pharmacy Consult for Heparin Indication: DVT  No Known Allergies  Patient Measurements: Height: 5\' 7"  (170.2 cm) Weight: 121 lb 4.8 oz (55.021 kg) IBW/kg (Calculated) : 61.6 Heparin Dosing Weight: 55kg  Vital Signs: Temp: 98.2 F (36.8 C) (05/12 1355) Temp Source: Oral (05/12 1355) BP: 104/45 mmHg (05/12 1355) Pulse Rate: 80 (05/12 1355)  Labs:  Recent Labs  12/08/15 1435 12/08/15 1448 12/09/15 0548  HGB 8.4*  --  8.8*  HCT 25.0*  --  25.6*  PLT 223  --  228  LABPROT  --  14.4  --   INR  --  1.10  --   CREATININE 0.34*  --  0.32*    Estimated Creatinine Clearance: 47.9 mL/min (by C-G formula based on Cr of 0.32).   Medical History: Past Medical History  Diagnosis Date  . Alzheimer's dementia uti  . UTI (urinary tract infection)   . Arthritis   . Renal disorder   . Dysphagia   . Anemia   . Gastro-esophageal reflux   . Diabetes mellitus without complication (HCC)   . Hypertension   . Bipolar 1 disorder (HCC)     Medications:  Prescriptions prior to admission  Medication Sig Dispense Refill Last Dose  . acetaminophen (TYLENOL) 500 MG tablet Take 500 mg by mouth every 8 (eight) hours as needed (osteoarthritis).   12/08/2015 at Unknown time  . Amino Acids-Protein Hydrolys (FEEDING SUPPLEMENT, PRO-STAT SUGAR FREE 64,) LIQD Take 30 mLs by mouth daily.   12/08/2015 at Unknown time  . amLODipine (NORVASC) 5 MG tablet Take 5 mg by mouth daily.   12/08/2015 at Unknown time  . atorvastatin (LIPITOR) 20 MG tablet Take 20 mg by mouth daily.   12/08/2015 at Unknown time  . bimatoprost (LUMIGAN) 0.01 % SOLN Place 1 drop into both eyes at bedtime.   12/07/2015 at Unknown time  . carbamazepine (TEGRETOL) 200 MG tablet Take 400 mg by mouth 2 (two) times daily.   12/08/2015 at Unknown time  . enalapril (VASOTEC) 5 MG tablet Take 5 mg by mouth daily.   12/08/2015 at Unknown time  . levofloxacin (LEVAQUIN) 500 MG tablet Take 500 mg by  mouth daily.   12/07/2015 at Unknown time  . LORazepam (ATIVAN) 0.5 MG tablet Take one tablet by mouth twice daily 60 tablet 5 12/08/2015 at Unknown time  . metFORMIN (GLUCOPHAGE) 500 MG tablet Take 500 mg by mouth 2 (two) times daily with a meal.   12/08/2015 at Unknown time  . mirtazapine (REMERON) 15 MG tablet Take 15 mg by mouth at bedtime.   12/07/2015 at Unknown time  . Multiple Vitamins-Minerals (MULTIVITAMIN WITH MINERALS) tablet Take 1 tablet by mouth daily.   12/08/2015 at Unknown time  . olopatadine (PATANOL) 0.1 % ophthalmic solution Place 1 drop into both eyes every 12 (twelve) hours.   12/08/2015 at Unknown time  . ranitidine (ZANTAC) 75 MG tablet Take 75 mg by mouth at bedtime.   12/07/2015 at Unknown time  . risperiDONE (RISPERDAL) 1 MG tablet Take 1 mg by mouth at bedtime.   12/07/2015 at Unknown time  . senna (SENOKOT) 8.6 MG TABS tablet Take 1 tablet by mouth at bedtime.   12/07/2015 at Unknown time  . aspirin 81 MG chewable tablet Chew 81 mg by mouth daily.   12/07/2015  . docusate sodium (COLACE) 100 MG capsule Take 100 mg by mouth daily.   12/06/2015    Assessment: 82 yo female admitted on  12/08/2015 with UTI and gross hematuria. Hgb 8.8. SHe underwent CT which showed no GU abnormalities except clot in the bladder. She was found to have a DVT and will be started on heparin.  Goal of Therapy:  Heparin level 0.3-0.7 units/ml Monitor platelets by anticoagulation protocol: Yes   Plan:  Give 2500 units bolus x 1 Start heparin infusion at 850 units/hr Check anti-Xa level in 8 hours and daily while on heparin Continue to monitor H&H and platelets  Elder Cyphers, BS Loura Back, BCPS Clinical Pharmacist Pager [phone] 12/09/2015,5:43 PM

## 2015-12-09 NOTE — Consult Note (Signed)
Urology Consult  Referring physician: Dr. Sarajane Jews Reason for referral: gross hematuria, clot retention  Chief Complaint: suprapubic pain  History of Present Illness: Melanie Baker is a 82yo with a hx of Alzheimers disease, DMII admitted last night with gross hematuria and worsening confusion. SHe underwent CT which showed no GU abnormalities except clot in the bladder. She was found to have a DVT and is currently on heparin. Per the patient daughter she gets numerous UTIs per year for the past 4-5 years. She also has issues with constipation and requires disimpaction on a regular basis. At baseline she has urgency, urge incontinence and wears pads. This is her first episode of gross hematuria. She is currently on CBI which is dark red.   Past Medical History  Diagnosis Date  . Alzheimer's dementia uti  . UTI (urinary tract infection)   . Arthritis   . Renal disorder   . Dysphagia   . Anemia   . Gastro-esophageal reflux   . Diabetes mellitus without complication ([place])   . Hypertension   . Bipolar 1 disorder [facility])    Past Surgical History  Procedure Laterality Date  . Cholecystectomy      Medications: I have reviewed the patient's current medications. Allergies: No Known Allergies  No family history on file. Social History:  reports that she has never smoked. She does not have any smokeless tobacco history on file. She reports that she does not drink alcohol or use illicit drugs.  Review of Systems  Gastrointestinal: Positive for nausea.  Genitourinary: Positive for dysuria, urgency, frequency and hematuria.  Neurological: Positive for weakness.  All other systems reviewed and are negative.   Physical Exam:  Vital signs in last 24 hours: Temp:  [98 F (36.7 C)-98.3 F (36.8 C)] 98 F (36.7 C) (05/12 0527) Pulse Rate:  [69-110] 106 (05/12 0527) Resp:  [17-20] 20 (05/12 0527) BP: (100-151)/(60-96) 151/75 mmHg (05/12 0527) SpO2:  [97 %-100 %] 97 % (05/12 0527) Weight:  [55.021  kg (121 lb 4.8 oz)-55.68 kg (122 lb 12 oz)] 55.021 kg (121 lb 4.8 oz) (05/12 0527) Physical Exam  Constitutional: She appears well-developed and well-nourished.  HENT:  Head: Normocephalic and atraumatic.  Eyes: EOM are normal. Pupils are equal, round, and reactive to light.  Neck: Normal range of motion. No thyromegaly present.  Cardiovascular: Normal rate and regular rhythm.   Respiratory: Effort normal. No respiratory distress.  GI: Soft. She exhibits no distension and no mass. There is no tenderness. There is no rebound and no guarding.  Musculoskeletal: Normal range of motion.  Neurological: She is alert. She has normal strength.  Skin: Skin is warm and dry.  Psychiatric: Her affect is inappropriate. Her speech is delayed. She is withdrawn. Cognition and memory are impaired. She expresses inappropriate judgment. She exhibits abnormal recent memory and abnormal remote memory.    Laboratory Data:  Results for orders placed or performed during the hospital encounter of 12/08/15 (from the past 72 hour(s))  CBC with Differential     Status: Abnormal   Collection Time: 12/08/15  2:35 PM  Result Value Ref Range   WBC 6.2 4.0 - 10.5 K/uL   RBC 2.62 (L) 3.87 - 5.11 MIL/uL   Hemoglobin 8.4 (L) 12.0 - 15.0 g/dL   HCT 25.0 (L) 36.0 - 46.0 %   MCV 95.4 78.0 - 100.0 fL   MCH 32.1 26.0 - 34.0 pg   MCHC 33.6 30.0 - 36.0 g/dL   RDW 12.8 11.5 - 15.5 %  Platelets 223 150 - 400 K/uL   Neutrophils Relative % 66 %   Neutro Abs 4.0 1.7 - 7.7 K/uL   Lymphocytes Relative 23 %   Lymphs Abs 1.4 0.7 - 4.0 K/uL   Monocytes Relative 6 %   Monocytes Absolute 0.4 0.1 - 1.0 K/uL   Eosinophils Relative 5 %   Eosinophils Absolute 0.3 0.0 - 0.7 K/uL   Basophils Relative 0 %   Basophils Absolute 0.0 0.0 - 0.1 K/uL  Basic metabolic panel     Status: Abnormal   Collection Time: 12/08/15  2:35 PM  Result Value Ref Range   Sodium 135 135 - 145 mmol/L   Potassium 4.2 3.5 - 5.1 mmol/L   Chloride 100 (L) 101  - 111 mmol/L   CO2 26 22 - 32 mmol/L   Glucose, Bld 107 (H) 65 - 99 mg/dL   BUN 14 6 - 20 mg/dL   Creatinine, Ser 0.34 (L) 0.44 - 1.00 mg/dL   Calcium 9.1 8.9 - 10.3 mg/dL   GFR calc non Af Amer >60 >60 mL/min   GFR calc Af Amer >60 >60 mL/min    Comment: (NOTE) The eGFR has been calculated using the CKD EPI equation. This calculation has not been validated in all clinical situations. eGFR's persistently <60 mL/min signify possible Chronic Kidney Disease.    Anion gap 9 5 - 15  Sample to Blood Bank     Status: None   Collection Time: 12/08/15  2:35 PM  Result Value Ref Range   Blood Bank Specimen SAMPLE AVAILABLE FOR TESTING    Sample Expiration 12/11/2015   Protime-INR     Status: None   Collection Time: 12/08/15  2:48 PM  Result Value Ref Range   Prothrombin Time 14.4 11.6 - 15.2 seconds   INR 1.10 0.00 - 1.49  Urinalysis, Routine w reflex microscopic     Status: Abnormal   Collection Time: 12/08/15  3:00 PM  Result Value Ref Range   Color, Urine RED (A) YELLOW    Comment: BIOCHEMICALS MAY BE AFFECTED BY COLOR   APPearance CLOUDY (A) CLEAR   Specific Gravity, Urine 1.010 1.005 - 1.030   pH 6.5 5.0 - 8.0   Glucose, UA 250 (A) NEGATIVE mg/dL   Hgb urine dipstick LARGE (A) NEGATIVE   Bilirubin Urine MODERATE (A) NEGATIVE   Ketones, ur 15 (A) NEGATIVE mg/dL   Protein, ur >300 (A) NEGATIVE mg/dL   Nitrite POSITIVE (A) NEGATIVE   Leukocytes, UA MODERATE (A) NEGATIVE  Urine microscopic-add on     Status: Abnormal   Collection Time: 12/08/15  3:00 PM  Result Value Ref Range   Squamous Epithelial / LPF 0-5 (A) NONE SEEN   WBC, UA 6-30 0 - 5 WBC/hpf   RBC / HPF TOO NUMEROUS TO COUNT 0 - 5 RBC/hpf   Bacteria, UA MANY (A) NONE SEEN  MRSA PCR Screening     Status: None   Collection Time: 12/09/15  1:12 AM  Result Value Ref Range   MRSA by PCR NEGATIVE NEGATIVE    Comment:        The GeneXpert MRSA Assay (FDA approved for NASAL specimens only), is one component of  a comprehensive MRSA colonization surveillance program. It is not intended to diagnose MRSA infection nor to guide or monitor treatment for MRSA infections.   Basic metabolic panel     Status: Abnormal   Collection Time: 12/09/15  5:48 AM  Result Value Ref Range   Sodium 135  135 - 145 mmol/L   Potassium 3.2 (L) 3.5 - 5.1 mmol/L    Comment: DELTA CHECK NOTED   Chloride 98 (L) 101 - 111 mmol/L   CO2 28 22 - 32 mmol/L   Glucose, Bld 134 (H) 65 - 99 mg/dL   BUN 11 6 - 20 mg/dL   Creatinine, Ser 0.32 (L) 0.44 - 1.00 mg/dL   Calcium 9.1 8.9 - 10.3 mg/dL   GFR calc non Af Amer >60 >60 mL/min   GFR calc Af Amer >60 >60 mL/min    Comment: (NOTE) The eGFR has been calculated using the CKD EPI equation. This calculation has not been validated in all clinical situations. eGFR's persistently <60 mL/min signify possible Chronic Kidney Disease.    Anion gap 9 5 - 15  CBC     Status: Abnormal   Collection Time: 12/09/15  5:48 AM  Result Value Ref Range   WBC 7.4 4.0 - 10.5 K/uL   RBC 2.67 (L) 3.87 - 5.11 MIL/uL   Hemoglobin 8.8 (L) 12.0 - 15.0 g/dL   HCT 25.6 (L) 36.0 - 46.0 %   MCV 95.9 78.0 - 100.0 fL   MCH 33.0 26.0 - 34.0 pg   MCHC 34.4 30.0 - 36.0 g/dL   RDW 12.9 11.5 - 15.5 %   Platelets 228 150 - 400 K/uL   Recent Results (from the past 240 hour(s))  MRSA PCR Screening     Status: None   Collection Time: 12/09/15  1:12 AM  Result Value Ref Range Status   MRSA by PCR NEGATIVE NEGATIVE Final    Comment:        The GeneXpert MRSA Assay (FDA approved for NASAL specimens only), is one component of a comprehensive MRSA colonization surveillance program. It is not intended to diagnose MRSA infection nor to guide or monitor treatment for MRSA infections.    Creatinine:  Recent Labs  12/08/15 1435 12/09/15 0548  CREATININE 0.34* 0.32*   Baseline Creatinine: 0.3  Impression/Assessment:  83yo with gross hematuria, UTI, clot retention and constipation  Plan:  1. 20  French foley placed and 120cc of clot evacuated from the bladder. Please continue CBI. Hematuria is likely related to UTI. Hematuria should resolve after 24-48hrs of antibiotics.  2. Continue rocephin pending urine culture 3. D/c foley prior to discharge  Urology to continue to follow 3.   Nicolette Bang 12/09/2015, 1:30 PM

## 2015-12-10 ENCOUNTER — Observation Stay (HOSPITAL_COMMUNITY): Payer: Medicare Other

## 2015-12-10 DIAGNOSIS — I82422 Acute embolism and thrombosis of left iliac vein: Secondary | ICD-10-CM | POA: Diagnosis not present

## 2015-12-10 DIAGNOSIS — Z66 Do not resuscitate: Secondary | ICD-10-CM | POA: Diagnosis present

## 2015-12-10 DIAGNOSIS — R319 Hematuria, unspecified: Secondary | ICD-10-CM | POA: Diagnosis present

## 2015-12-10 DIAGNOSIS — F0390 Unspecified dementia without behavioral disturbance: Secondary | ICD-10-CM | POA: Diagnosis present

## 2015-12-10 DIAGNOSIS — K5641 Fecal impaction: Secondary | ICD-10-CM | POA: Diagnosis not present

## 2015-12-10 DIAGNOSIS — R310 Gross hematuria: Secondary | ICD-10-CM | POA: Diagnosis present

## 2015-12-10 DIAGNOSIS — N3289 Other specified disorders of bladder: Secondary | ICD-10-CM | POA: Diagnosis present

## 2015-12-10 DIAGNOSIS — I82412 Acute embolism and thrombosis of left femoral vein: Secondary | ICD-10-CM | POA: Diagnosis present

## 2015-12-10 DIAGNOSIS — G309 Alzheimer's disease, unspecified: Secondary | ICD-10-CM | POA: Diagnosis present

## 2015-12-10 DIAGNOSIS — N390 Urinary tract infection, site not specified: Secondary | ICD-10-CM | POA: Diagnosis present

## 2015-12-10 DIAGNOSIS — F0280 Dementia in other diseases classified elsewhere without behavioral disturbance: Secondary | ICD-10-CM | POA: Diagnosis present

## 2015-12-10 DIAGNOSIS — D62 Acute posthemorrhagic anemia: Secondary | ICD-10-CM | POA: Diagnosis present

## 2015-12-10 DIAGNOSIS — E119 Type 2 diabetes mellitus without complications: Secondary | ICD-10-CM | POA: Diagnosis present

## 2015-12-10 DIAGNOSIS — K219 Gastro-esophageal reflux disease without esophagitis: Secondary | ICD-10-CM | POA: Diagnosis present

## 2015-12-10 DIAGNOSIS — Z7401 Bed confinement status: Secondary | ICD-10-CM | POA: Diagnosis not present

## 2015-12-10 DIAGNOSIS — E876 Hypokalemia: Secondary | ICD-10-CM | POA: Diagnosis present

## 2015-12-10 LAB — GLUCOSE, CAPILLARY
GLUCOSE-CAPILLARY: 167 mg/dL — AB (ref 65–99)
Glucose-Capillary: 241 mg/dL — ABNORMAL HIGH (ref 65–99)
Glucose-Capillary: 140 mg/dL — ABNORMAL HIGH (ref 65–99)
Glucose-Capillary: 182 mg/dL — ABNORMAL HIGH (ref 65–99)

## 2015-12-10 LAB — CBC
HCT: 23.9 % — ABNORMAL LOW (ref 36.0–46.0)
HEMATOCRIT: 22 % — AB (ref 36.0–46.0)
HEMOGLOBIN: 7.5 g/dL — AB (ref 12.0–15.0)
HEMOGLOBIN: 8.2 g/dL — AB (ref 12.0–15.0)
MCH: 32.5 pg (ref 26.0–34.0)
MCH: 32.9 pg (ref 26.0–34.0)
MCHC: 34.1 g/dL (ref 30.0–36.0)
MCHC: 34.3 g/dL (ref 30.0–36.0)
MCV: 95.2 fL (ref 78.0–100.0)
MCV: 96 fL (ref 78.0–100.0)
Platelets: 237 10*3/uL (ref 150–400)
Platelets: 232 10*3/uL (ref 150–400)
RBC: 2.31 MIL/uL — ABNORMAL LOW (ref 3.87–5.11)
RBC: 2.49 MIL/uL — AB (ref 3.87–5.11)
RDW: 13.1 % (ref 11.5–15.5)
RDW: 13.2 % (ref 11.5–15.5)
WBC: 12.4 10*3/uL — ABNORMAL HIGH (ref 4.0–10.5)
WBC: 16.3 10*3/uL — AB (ref 4.0–10.5)

## 2015-12-10 LAB — URINE CULTURE

## 2015-12-10 LAB — HEPARIN LEVEL (UNFRACTIONATED)
Heparin Unfractionated: 0.13 IU/mL — ABNORMAL LOW (ref 0.30–0.70)
Heparin Unfractionated: 0.33 IU/mL (ref 0.30–0.70)

## 2015-12-10 MED ORDER — HEPARIN BOLUS VIA INFUSION
1500.0000 [IU] | Freq: Once | INTRAVENOUS | Status: AC
  Administered 2015-12-10: 1500 [IU] via INTRAVENOUS
  Filled 2015-12-10: qty 1500

## 2015-12-10 NOTE — Progress Notes (Addendum)
ANTICOAGULATION CONSULT NOTE - Initial Consult  Pharmacy Consult for Heparin Indication: DVT  No Known Allergies  Patient Measurements: Height:  (170.2 cm) Weight: 121 lb 4.8 oz (55.021 kg) IBW/kg (Calculated) : 61.6 Heparin Dosing Weight: 55kg  Vital Signs: Temp: 98 F (36.7 C) (05/13 0605) Temp Source: Oral (05/13 0605) BP: 111/65 mmHg (05/12 2100) Pulse Rate: 73 (05/12 2100)  Labs:  Recent Labs  12/08/15 1435 12/08/15 1448 12/09/15 0548 12/10/15 0609  HGB 8.4*  --  8.8* 7.5*  HCT 25.0*  --  25.6* 22.0*  PLT 223  --  228 237  LABPROT  --  14.4  --   --   INR  --  1.10  --   --   HEPARINUNFRC  --   --   --  0.13*  CREATININE 0.34*  --  0.32*  --     Estimated Creatinine Clearance: 47.9 mL/min (by C-G formula based on Cr of 0.32).   Medical History: Past Medical History  Diagnosis Date  . Alzheimer's dementia uti  . UTI (urinary tract infection)   . Arthritis   . Renal disorder   . Dysphagia   . Anemia   . Gastro-esophageal reflux   . Diabetes mellitus without complication (HCC)   . Hypertension   . Bipolar 1 disorder (HCC)     Medications:  Prescriptions prior to admission  Medication Sig Dispense Refill Last Dose  . acetaminophen (TYLENOL) 500 MG tablet Take 500 mg by mouth every 8 (eight) hours as needed (osteoarthritis).   12/08/2015 at Unknown time  . Amino Acids-Protein Hydrolys (FEEDING SUPPLEMENT, PRO-STAT SUGAR FREE 64,) LIQD Take 30 mLs by mouth daily.   12/08/2015 at Unknown time  . amLODipine (NORVASC) 5 MG tablet Take 5 mg by mouth daily.   12/08/2015 at Unknown time  . atorvastatin (LIPITOR) 20 MG tablet Take 20 mg by mouth daily.   12/08/2015 at Unknown time  . bimatoprost (LUMIGAN) 0.01 % SOLN Place 1 drop into both eyes at bedtime.   12/07/2015 at Unknown time  . carbamazepine (TEGRETOL) 200 MG tablet Take 400 mg by mouth 2 (two) times daily.   12/08/2015 at Unknown time  . enalapril (VASOTEC) 5 MG tablet Take 5 mg by mouth daily.    12/08/2015 at Unknown time  . levofloxacin (LEVAQUIN) 500 MG tablet Take 500 mg by mouth daily.   12/07/2015 at Unknown time  . LORazepam (ATIVAN) 0.5 MG tablet Take one tablet by mouth twice daily 60 tablet 5 12/08/2015 at Unknown time  . metFORMIN (GLUCOPHAGE) 500 MG tablet Take 500 mg by mouth 2 (two) times daily with a meal.   12/08/2015 at Unknown time  . mirtazapine (REMERON) 15 MG tablet Take 15 mg by mouth at bedtime.   12/07/2015 at Unknown time  . Multiple Vitamins-Minerals (MULTIVITAMIN WITH MINERALS) tablet Take 1 tablet by mouth daily.   12/08/2015 at Unknown time  . olopatadine (PATANOL) 0.1 % ophthalmic solution Place 1 drop into both eyes every 12 (twelve) hours.   12/08/2015 at Unknown time  . ranitidine (ZANTAC) 75 MG tablet Take 75 mg by mouth at bedtime.   12/07/2015 at Unknown time  . risperiDONE (RISPERDAL) 1 MG tablet Take 1 mg by mouth at bedtime.   12/07/2015 at Unknown time  . senna (SENOKOT) 8.6 MG TABS tablet Take 1 tablet by mouth at bedtime.   12/07/2015 at Unknown time  . aspirin 81 MG chewable tablet Chew 81 mg by mouth daily.   12/07/2015  .  docusate sodium (COLACE) 100 MG capsule Take 100 mg by mouth daily.   12/06/2015    Assessment: 82 yo female admitted on 12/08/2015 with UTI and gross hematuria. Hgb 8.8. SHe underwent CT which showed no GU abnormalities except clot in the bladder. She was found to have a DVT and started on heparin. Heparin level 0.13, subtherapeutic. Will adjust. Hgb 8.8--> 7.5.   Goal of Therapy:  Heparin level 0.3-0.7 units/ml Monitor platelets by anticoagulation protocol: Yes   Plan:  Give 1500 units bolus x 1 increase heparin infusion at 1050 units/hr Check anti-Xa level in 8 hours and daily while on heparin Continue to monitor H&H and platelets  Elder CyphersLorie Favor Kreh, BS Loura BackPharm D, BCPS Clinical Pharmacist Pager [phone] 12/10/2015,8:29 AM

## 2015-12-10 NOTE — Progress Notes (Signed)
ANTICOAGULATION CONSULT NOTE   Pharmacy Consult for Heparin Indication: DVT  No Known Allergies  Patient Measurements: Height: 5\' 7"  (170.2 cm) Weight: 121 lb 4.8 oz (55.021 kg) IBW/kg (Calculated) : 61.6 Heparin Dosing Weight: 55kg  Vital Signs: Temp: 98.2 F (36.8 C) (05/13 1548) BP: 96/47 mmHg (05/13 1548) Pulse Rate: 113 (05/13 1548)  Labs:  Recent Labs  12/08/15 1435 12/08/15 1448 12/09/15 0548 12/10/15 0609 12/10/15 1056 12/10/15 1656  HGB 8.4*  --  8.8* 7.5* 8.2*  --   HCT 25.0*  --  25.6* 22.0* 23.9*  --   PLT 223  --  228 237 232  --   LABPROT  --  14.4  --   --   --   --   INR  --  1.10  --   --   --   --   HEPARINUNFRC  --   --   --  0.13*  --  0.33  CREATININE 0.34*  --  0.32*  --   --   --     Estimated Creatinine Clearance: 47.9 mL/min (by C-G formula based on Cr of 0.32).   Medical History: Past Medical History  Diagnosis Date  . Alzheimer's dementia uti  . UTI (urinary tract infection)   . Arthritis   . Renal disorder   . Dysphagia   . Anemia   . Gastro-esophageal reflux   . Diabetes mellitus without complication (HCC)   . Hypertension   . Bipolar 1 disorder (HCC)     Medications:  Prescriptions prior to admission  Medication Sig Dispense Refill Last Dose  . acetaminophen (TYLENOL) 500 MG tablet Take 500 mg by mouth every 8 (eight) hours as needed (osteoarthritis).   12/08/2015 at Unknown time  . Amino Acids-Protein Hydrolys (FEEDING SUPPLEMENT, PRO-STAT SUGAR FREE 64,) LIQD Take 30 mLs by mouth daily.   12/08/2015 at Unknown time  . amLODipine (NORVASC) 5 MG tablet Take 5 mg by mouth daily.   12/08/2015 at Unknown time  . atorvastatin (LIPITOR) 20 MG tablet Take 20 mg by mouth daily.   12/08/2015 at Unknown time  . bimatoprost (LUMIGAN) 0.01 % SOLN Place 1 drop into both eyes at bedtime.   12/07/2015 at Unknown time  . carbamazepine (TEGRETOL) 200 MG tablet Take 400 mg by mouth 2 (two) times daily.   12/08/2015 at Unknown time  . enalapril  (VASOTEC) 5 MG tablet Take 5 mg by mouth daily.   12/08/2015 at Unknown time  . levofloxacin (LEVAQUIN) 500 MG tablet Take 500 mg by mouth daily.   12/07/2015 at Unknown time  . LORazepam (ATIVAN) 0.5 MG tablet Take one tablet by mouth twice daily 60 tablet 5 12/08/2015 at Unknown time  . metFORMIN (GLUCOPHAGE) 500 MG tablet Take 500 mg by mouth 2 (two) times daily with a meal.   12/08/2015 at Unknown time  . mirtazapine (REMERON) 15 MG tablet Take 15 mg by mouth at bedtime.   12/07/2015 at Unknown time  . Multiple Vitamins-Minerals (MULTIVITAMIN WITH MINERALS) tablet Take 1 tablet by mouth daily.   12/08/2015 at Unknown time  . olopatadine (PATANOL) 0.1 % ophthalmic solution Place 1 drop into both eyes every 12 (twelve) hours.   12/08/2015 at Unknown time  . ranitidine (ZANTAC) 75 MG tablet Take 75 mg by mouth at bedtime.   12/07/2015 at Unknown time  . risperiDONE (RISPERDAL) 1 MG tablet Take 1 mg by mouth at bedtime.   12/07/2015 at Unknown time  . senna (SENOKOT) 8.6 MG  TABS tablet Take 1 tablet by mouth at bedtime.   12/07/2015 at Unknown time  . aspirin 81 MG chewable tablet Chew 81 mg by mouth daily.   12/07/2015  . docusate sodium (COLACE) 100 MG capsule Take 100 mg by mouth daily.   12/06/2015    Assessment: 82 yo female admitted on 12/08/2015 with UTI and gross hematuria. Hgb 8.8. SHe underwent CT which showed no GU abnormalities except clot in the bladder. She was found to have a DVT and started on heparin. Heparin level 0.33, therapeutic.  Goal of Therapy:  Heparin level 0.3-0.7 units/ml Monitor platelets by anticoagulation protocol: Yes   Plan:  Continue heparin infusion at 1050 units/hr Check anti-Xa level in 8 hours and daily while on heparin Continue to monitor H&H and platelets  Elder Cyphers, BS Loura Back, BCPS Clinical Pharmacist Pager [phone] 12/10/2015,9:36 PM

## 2015-12-10 NOTE — Progress Notes (Addendum)
Patient ID: Melanie AlimentDoris Konczal, female   DOB: 07-14-1933, 82 y.o.   MRN: [id number]    Subjective: Urine has remained clear overnight.  Pt unable to answer questions but denies pain.  Objective: Vital signs in last 24 hours: Temp:  [97.7 F (36.5 C)-98.2 F (36.8 C)] 98 F (36.7 C) (05/13 0605) Pulse Rate:  [73-80] 73 (05/12 2100) Resp:  [20] 20 (05/12 2100) BP: (104-111)/(45-65) 111/65 mmHg (05/12 2100) SpO2:  [98 %] 98 % (05/12 2100)  Intake/Output from previous day: 05/12 0701 - 05/13 0700 In: 480 [P.O.:480] Out: [id number] [Urine:48700] Intake/Output this shift: Total I/O In: -  Out: 5000 [Urine:5000]  Physical Exam:  General: Alert and oriented Abd: Soft, ND GU: Urine clear even after stopping CBI and monitoring urine  Lab Results:  Recent Labs  12/09/15 0548 12/10/15 0609 12/10/15 1056  HGB 8.8* 7.5* 8.2*  HCT 25.6* 22.0* 23.9*   BMET  Recent Labs  12/08/15 1435 12/09/15 0548  NA 135 135  K 4.2 3.2*  CL 100* 98*  CO2 26 28  GLUCOSE 107* 134*  BUN 14 11  CREATININE 0.34* 0.32*  CALCIUM 9.1 9.1     Studies/Results:  Assessment/Plan: 1) Gross hematuria: Thought to be secondary to UTI and exacerbated by anticoagulation.  Urine now clear off CBI without evidence of ongoing bleeding (Hgb stable).  Will hold CBI.  Ok to remove catheter and give voiding trial tomorrow if urine remains clear.  Please call urology if urine is not clear.  Will sign off. No further urologic follow up is necessary.  2) UTI: Urine culture with multiple species.  New culture sent since WBC is rising.  May need empiric therapy if culture remains negative.     Kaydense Rizo,LES 12/10/2015, 11:57 AM

## 2015-12-10 NOTE — Progress Notes (Signed)
PROGRESS NOTE  Melanie AlimentDoris Baker HQI:[id number] DOB: 1932-09-06 DOA: 12/08/2015 PCP: No primary care provider on file.  Brief Narrative: 82 year old woman with history of advanced dementia, bedridden presented from skilled nursing facility with gross hematuria after insertion of a Foley and acute blood loss anemia. She was admitted for continuous bladder irrigation and urology evaluation. Also noted to have DVT.  Assessment/Plan: 1. Gross hematuria. Felt to be secondary to infection. Okay for anticoagulation for urology. Hemoglobin remained stable. Hematuria seems to have resolved. 2. UTI. Continue Rocephin. Urine culture unrevealing. Repeat culture ordered. 3. Fecal impaction, recurrent. Continue bowel regimen. 4. Left iliac DVT, significant left lower summary DVT. Continue heparin. 5. Advanced dementia. Appears to be stable. 6. Bedridden. 7. DM Type 2. Stable.    Overall stable. Hematuria has resolved and hemoglobin remained stable. Discussed with Dr. Laverle PatterBorden this morning, will continue anticoagulation, pursue voiding trial tomorrow with removal of Foley catheter.  Change to oral antibiotics 5/14.  I suspect that her leukocytosis is related to DVT rather than worsening infection. She remains afebrile.  Discussed in detail with daughter Clydie BraunKaren by telephone. She agrees with anticoagulation. We discussed the risk and benefits of new or versus older agents. Plan start Eliquis tomorrow.  DVT prophylaxis:Heparin Code Status: DNR Family Communication: No family bedside Disposition Plan: Anticipate discharge in 24 hours.   Brendia Sacksaniel Django Nguyen, MD  [facility] Direct contact:  --Via amion app OR  --[email or web address]; password [email or web address] and click  7PM-7AM contact night coverage as above 12/10/2015, 9:00 AM    Consultants:  Urology   Procedures:  None  Antimicrobials:  Ceftriaxone 5/11>>  HPI/Subjective:  History limited secondary to dementia.  Objective: Filed Vitals:   12/09/15 0527  12/09/15 1355 12/09/15 2100 12/10/15 0605  BP: 151/75 104/45 111/65   Pulse: 106 80 73   Temp: 98 F (36.7 C) 98.2 F (36.8 C) 97.7 F (36.5 C) 98 F (36.7 C)  TempSrc: Oral Oral Oral Oral  Resp: 20 20 20    Height:      Weight: 55.021 kg (121 lb 4.8 oz)     SpO2: 97% 98% 98%     Intake/Output Summary (Last 24 hours) at 12/10/15 0900 Last data filed at 12/10/15 [id number]  Gross per 24 hour  Intake    480 ml  Output  [id number] ml  Net -40220 ml     Filed Weights   12/08/15 2033 12/09/15 0342 12/09/15 0527  Weight: 55.067 kg (121 lb 6.4 oz) 55.68 kg (122 lb 12 oz) 55.021 kg (121 lb 4.8 oz)    Exam: Constitutional:  Appears calm and comfortable Respiratory:  CTA bilaterally, no w/r/r.  Respiratory effort normal. No retractions or accessory muscle use Cardiovascular:  Tachycardiac regular rhythm , no m/r/g 2+ BLE pedal edema   Abdomen:  Abdomen appears normal; no tenderness or masses Urine yellow, no clots seen Psychiatric:  Unable to assess  I have personally reviewed following labs and imaging studies:  Hgb 7.5 >> 8.2  WBC up to 16.3  UC unrevealing.   Scheduled Meds: . amLODipine  5 mg Oral Daily  . atorvastatin  20 mg Oral q1800  . carbamazepine  400 mg Oral BID  . cefTRIAXone (ROCEPHIN)  IV  1 g Intravenous Q24H  . enalapril  5 mg Oral Daily  . famotidine  10 mg Oral Daily  . feeding supplement (PRO-STAT SUGAR FREE 64)  30 mL Oral Daily  . heparin  1,500 Units Intravenous Once  . insulin aspart  0-9 Units Subcutaneous TID  WC  . latanoprost  1 drop Both Eyes QHS  . LORazepam  0.5 mg Oral BID  . mirtazapine  15 mg Oral QHS  . multivitamin with minerals  1 tablet Oral Daily  . olopatadine  1 drop Both Eyes Q12H  . polyethylene glycol  17 g Oral BID  . risperiDONE  1 mg Oral QHS  . senna  1 tablet Oral QHS   Continuous Infusions: . heparin 850 Units/hr (12/09/15 1822)    Principal Problem:   Gross hematuria Active Problems:   Fecal impaction (HCC)    Dementia   Bedridden   Iliac DVT (deep venous thrombosis) (HCC)   Anemia   Blood clot in bladder   Deep vein thrombosis (DVT) of iliac vein of left lower extremity (HCC)   Urinary tract infectious disease     Time spent 25 minutes   By signing my name below, I, Zadie Cleverly attest that this documentation has been prepared under the direction and in the presence of Brendia Sacks, MD Electronically signed: Zadie Cleverly  12/10/2015  1:05pm   I personally performed the services described in this documentation. All medical record entries made by the scribe were at my direction. I have reviewed the chart and agree that the record reflects my personal performance and is accurate and complete. Brendia Sacks, MD

## 2015-12-11 DIAGNOSIS — N3001 Acute cystitis with hematuria: Secondary | ICD-10-CM

## 2015-12-11 DIAGNOSIS — D62 Acute posthemorrhagic anemia: Secondary | ICD-10-CM

## 2015-12-11 LAB — CBC WITH DIFFERENTIAL/PLATELET
BASOS PCT: 0 %
Basophils Absolute: 0 10*3/uL (ref 0.0–0.1)
EOS PCT: 0 %
Eosinophils Absolute: 0 10*3/uL (ref 0.0–0.7)
HEMATOCRIT: 19.8 % — AB (ref 36.0–46.0)
Hemoglobin: 6.6 g/dL — CL (ref 12.0–15.0)
Lymphocytes Relative: 10 %
Lymphs Abs: 1.1 10*3/uL (ref 0.7–4.0)
MCH: 31.6 pg (ref 26.0–34.0)
MCHC: 33.3 g/dL (ref 30.0–36.0)
MCV: 94.7 fL (ref 78.0–100.0)
MONO ABS: 0.7 10*3/uL (ref 0.1–1.0)
Monocytes Relative: 6 %
NEUTROS ABS: 9.5 10*3/uL — AB (ref 1.7–7.7)
Neutrophils Relative %: 84 %
PLATELETS: 218 10*3/uL (ref 150–400)
RBC: 2.09 MIL/uL — AB (ref 3.87–5.11)
RDW: 13.1 % (ref 11.5–15.5)
WBC: 11.3 10*3/uL — AB (ref 4.0–10.5)

## 2015-12-11 LAB — TROPONIN I: Troponin I: <0.03 ng/mL (ref <0.031)

## 2015-12-11 LAB — COMPREHENSIVE METABOLIC PANEL
ALT: 15 U/L (ref 14–54)
ANION GAP: 4 — AB (ref 5–15)
AST: 20 U/L (ref 15–41)
Albumin: 2.3 g/dL — ABNORMAL LOW (ref 3.5–5.0)
Alkaline Phosphatase: 56 U/L (ref 38–126)
BILIRUBIN TOTAL: 0.3 mg/dL (ref 0.3–1.2)
BUN: 15 mg/dL (ref 6–20)
CHLORIDE: 108 mmol/L (ref 101–111)
CO2: 23 mmol/L (ref 22–32)
Calcium: 7.5 mg/dL — ABNORMAL LOW (ref 8.9–10.3)
Creatinine, Ser: 0.31 mg/dL — ABNORMAL LOW (ref 0.44–1.00)
Glucose, Bld: 137 mg/dL — ABNORMAL HIGH (ref 65–99)
POTASSIUM: 2.1 mmol/L — AB (ref 3.5–5.1)
Sodium: 135 mmol/L (ref 135–145)
TOTAL PROTEIN: 5.1 g/dL — AB (ref 6.5–8.1)

## 2015-12-11 LAB — ABO/RH: ABO/RH(D): AB POS

## 2015-12-11 LAB — LACTIC ACID, PLASMA
LACTIC ACID, VENOUS: 0.5 mmol/L (ref 0.5–2.0)
LACTIC ACID, VENOUS: 0.6 mmol/L (ref 0.5–2.0)

## 2015-12-11 LAB — GLUCOSE, CAPILLARY
GLUCOSE-CAPILLARY: 137 mg/dL — AB (ref 65–99)
GLUCOSE-CAPILLARY: 149 mg/dL — AB (ref 65–99)
Glucose-Capillary: 147 mg/dL — ABNORMAL HIGH (ref 65–99)
Glucose-Capillary: 154 mg/dL — ABNORMAL HIGH (ref 65–99)

## 2015-12-11 LAB — SAMPLE TO BLOOD BANK

## 2015-12-11 LAB — HEPARIN LEVEL (UNFRACTIONATED): Heparin Unfractionated: 0.32 IU/mL (ref 0.30–0.70)

## 2015-12-11 LAB — PREPARE RBC (CROSSMATCH)

## 2015-12-11 MED ORDER — SODIUM CHLORIDE 0.9 % IV SOLN
INTRAVENOUS | Status: DC
  Administered 2015-12-11: 06:00:00 via INTRAVENOUS

## 2015-12-11 MED ORDER — POTASSIUM CHLORIDE 20 MEQ PO PACK
40.0000 meq | PACK | ORAL | Status: DC
  Administered 2015-12-11 (×3): 40 meq via ORAL
  Filled 2015-12-11 (×3): qty 2

## 2015-12-11 MED ORDER — POTASSIUM CHLORIDE 10 MEQ/100ML IV SOLN
10.0000 meq | INTRAVENOUS | Status: AC
  Administered 2015-12-11 (×4): 10 meq via INTRAVENOUS
  Filled 2015-12-11: qty 100

## 2015-12-11 MED ORDER — SODIUM CHLORIDE 0.9 % IV SOLN
Freq: Once | INTRAVENOUS | Status: AC
  Administered 2015-12-11: 13:00:00 via INTRAVENOUS

## 2015-12-11 MED ORDER — MAGNESIUM SULFATE 2 GM/50ML IV SOLN
2.0000 g | Freq: Once | INTRAVENOUS | Status: AC
  Administered 2015-12-11: 2 g via INTRAVENOUS
  Filled 2015-12-11: qty 50

## 2015-12-11 NOTE — Progress Notes (Signed)
ANTICOAGULATION CONSULT NOTE   Pharmacy Consult for Heparin Indication: DVT  No Known Allergies  Patient Measurements: Height:  (170.2 cm) Weight: 121 lb 4.8 oz (55.021 kg) IBW/kg (Calculated) : 61.6 Heparin Dosing Weight: 55kg  Vital Signs: Temp: 99.6 F (37.6 C) (05/14 0731) Temp Source: Oral (05/14 0552) BP: 127/92 mmHg (05/14 0731) Pulse Rate: 115 (05/14 0731)  Labs:  Recent Labs  12/08/15 1435 12/08/15 1448 12/09/15 0548 12/10/15 0609 12/10/15 1056 12/10/15 1656 12/11/15 0637  HGB 8.4*  --  8.8* 7.5* 8.2*  --  6.6*  HCT 25.0*  --  25.6* 22.0* 23.9*  --  19.8*  PLT 223  --  228 237 232  --  218  LABPROT  --  14.4  --   --   --   --   --   INR  --  1.10  --   --   --   --   --   HEPARINUNFRC  --   --   --  0.13*  --  0.33 0.32  CREATININE 0.34*  --  0.32*  --   --   --   --     Estimated Creatinine Clearance: 47.9 mL/min (by C-G formula based on Cr of 0.32).   Medical History: Past Medical History  Diagnosis Date  . Alzheimer's dementia uti  . UTI (urinary tract infection)   . Arthritis   . Renal disorder   . Dysphagia   . Anemia   . Gastro-esophageal reflux   . Diabetes mellitus without complication (HCC)   . Hypertension   . Bipolar 1 disorder (HCC)     Medications:  Prescriptions prior to admission  Medication Sig Dispense Refill Last Dose  . acetaminophen (TYLENOL) 500 MG tablet Take 500 mg by mouth every 8 (eight) hours as needed (osteoarthritis).   12/08/2015 at Unknown time  . Amino Acids-Protein Hydrolys (FEEDING SUPPLEMENT, PRO-STAT SUGAR FREE 64,) LIQD Take 30 mLs by mouth daily.   12/08/2015 at Unknown time  . amLODipine (NORVASC) 5 MG tablet Take 5 mg by mouth daily.   12/08/2015 at Unknown time  . atorvastatin (LIPITOR) 20 MG tablet Take 20 mg by mouth daily.   12/08/2015 at Unknown time  . bimatoprost (LUMIGAN) 0.01 % SOLN Place 1 drop into both eyes at bedtime.   12/07/2015 at Unknown time  . carbamazepine (TEGRETOL) 200 MG tablet  Take 400 mg by mouth 2 (two) times daily.   12/08/2015 at Unknown time  . enalapril (VASOTEC) 5 MG tablet Take 5 mg by mouth daily.   12/08/2015 at Unknown time  . levofloxacin (LEVAQUIN) 500 MG tablet Take 500 mg by mouth daily.   12/07/2015 at Unknown time  . LORazepam (ATIVAN) 0.5 MG tablet Take one tablet by mouth twice daily 60 tablet 5 12/08/2015 at Unknown time  . metFORMIN (GLUCOPHAGE) 500 MG tablet Take 500 mg by mouth 2 (two) times daily with a meal.   12/08/2015 at Unknown time  . mirtazapine (REMERON) 15 MG tablet Take 15 mg by mouth at bedtime.   12/07/2015 at Unknown time  . Multiple Vitamins-Minerals (MULTIVITAMIN WITH MINERALS) tablet Take 1 tablet by mouth daily.   12/08/2015 at Unknown time  . olopatadine (PATANOL) 0.1 % ophthalmic solution Place 1 drop into both eyes every 12 (twelve) hours.   12/08/2015 at Unknown time  . ranitidine (ZANTAC) 75 MG tablet Take 75 mg by mouth at bedtime.   12/07/2015 at Unknown time  . risperiDONE (RISPERDAL) 1 MG  tablet Take 1 mg by mouth at bedtime.   12/07/2015 at Unknown time  . senna (SENOKOT) 8.6 MG TABS tablet Take 1 tablet by mouth at bedtime.   12/07/2015 at Unknown time  . aspirin 81 MG chewable tablet Chew 81 mg by mouth daily.   12/07/2015  . docusate sodium (COLACE) 100 MG capsule Take 100 mg by mouth daily.   12/06/2015    Assessment: 82 yo female admitted on 12/08/2015 with UTI and gross hematuria. Hgb 8.8. SHe underwent CT which showed no GU abnormalities except clot in the bladder. She was found to have a DVT and started on heparin. Heparin level 0.32, therapeutic; however, hgb has dropped to 6.6.  MD notified.   Goal of Therapy:  Heparin level 0.3-0.7 units/ml Monitor platelets by anticoagulation protocol: Yes   Plan:  Continue heparin infusion at 1050 units/hr Check anti-Xa level in 8 hours and daily while on heparin Continue to monitor H&H and platelets  Elder CyphersLorie Jacquez Sheetz, BS Loura BackPharm D, BCPS Clinical Pharmacist Pager  [phone] 12/11/2015,7:54 AM

## 2015-12-11 NOTE — Progress Notes (Signed)
D- Patient appears to have more generalized weakness this am while getting bathed. She is aroused by verbal stimuli with the same orientation as her previous assessment. Her body feels hot to touch as if she is feverish but her oral temperature is 100.0.  Her HR increased from below 100 to 116 this am and her SBP remains above 100.  Her lab work reveals a  WBC that  trended up to 16.3.  A- Dr. Selena BattenKim notified of the above and my concerns of possible sepsis.  [facility]Kim [facility].  Interventions were carried out per orders.  R- Patient in bed and remains easily aroused by verbal stimuli with the same orientation as her previous assessment. She denies complaint at this time.  Nursing staff to continue to monitor.

## 2015-12-11 NOTE — Progress Notes (Signed)
PROGRESS NOTE  Melanie AlimentDoris Baker JYN:[id number] DOB: [birth year] DOA: 12/08/2015 PCP: No primary care provider on file.  Brief Narrative: 82 year old woman with history of advanced dementia, bedridden presented from skilled nursing facility with gross hematuria after insertion of a Foley and acute blood loss anemia. She was admitted for continuous bladder irrigation and urology evaluation. Also noted to have DVT. Her hgb was noted to drop to 6.6 on 5/14, so she was transfused pRBCs.  Assessment/Plan: 1. Gross hematuria. Resolved. 24 hours. Thought secondary to infection. Anticoagulation okay per urology. 2. Acute blood loss anemia. Hematuria. No evidence of ongoing bleeding. Likely equilibration and dilution. Sinus tachycardia noted, could be secondary to this. 3. Left lower extremity and iliac DVT. Continue heparin. Discussed with daughter, transition to oral anticoagulation tomorrow if hemoglobin stable. 4. Hypokalemia, likely dietary in nature. Asymptomatic. EKG sinus tachycardia, no acute changes. 5. Low-grade fever. Lactic acid was normal. Leukocytosis trending down. Hemodynamics are stable. There is no evidence of sepsis. Likely secondary to DVT. 6. UTI, continue empiric treatment. Urine culture is unrevealing. Blood cultures pending. 7. Fecal impaction 8. Advanced dementia. Appears to be stable. 9. Bedridden. 10. DM Type 2. Stable.    No evidence of bleeding. HGB 6.6, will transfuse pRBC today (discussed with daughter Clydie BraunKaren by telephone who gave permission)  Continue heparin, if hemoglobin stable after transfusion, start oral agent 5/15.  Continue abx.  Replace potassium, give empiric magnesium  Check potassium, Mg, and CBC in AM. If stable, likely release home in AM.  DVT prophylaxis:Heparin Code Status: DNR Family Communication: Updated daughter Clydie BraunKaren by telephone this morning Disposition Plan: Anticipate discharge in 24 hours.   Brendia Sacksaniel Goodrich, MD  [facility] Direct  contact:  --Via amion app OR  --[email or web address]; password [email or web address] and click  7PM-7AM contact night coverage as above 12/11/2015, 2:05 PM  LOS: 1 day   Consultants:  Urology   Procedures:  pRBC transfusion 5/14  Antimicrobials:  Ceftriaxone 5/11>>  HPI/Subjective:  H noted to be more generally weak overnight with low-grade temperature in sinus tachycardia as well as somewhat elevated WBC.   History limited secondary to dementia. No bleeding noted overnight per RN.  Objective: Filed Vitals:   12/11/15 0731 12/11/15 1038 12/11/15 1309 12/11/15 1335  BP: 127/92  103/52 110/65  Pulse: 115  95 101  Temp: 99.6 F (37.6 C)  98.6 F (37 C) 99.2 F (37.3 C)  TempSrc:   Oral Oral  Resp: 20  20 20   Height:      Weight:      SpO2: 99% 97% 98% 98%    Intake/Output Summary (Last 24 hours) at 12/11/15 1405 Last data filed at 12/11/15 0553  Gross per 24 hour  Intake    240 ml  Output   3900 ml  Net  -3660 ml     Filed Weights   12/08/15 2033 12/09/15 0342 12/09/15 0527  Weight: 55.067 kg (121 lb 6.4 oz) 55.68 kg (122 lb 12 oz) 55.021 kg (121 lb 4.8 oz)    Exam: Constitutional:  . Appears calm and comfortable, Chronically ill Respiratory:  . CTA bilaterally, no w/r/r.  . Respiratory effort normal. No retractions or accessory muscle use Cardiovascular:  . Tachycardic. Regular rhythm.  no m/r/g . 2+ bilateral pedal edema.   Abdomen:  . Urine is clear yellow in foley. Psychiatric:  o Unable to assess, awake  I have personally reviewed following labs and imaging studies:  Blood sugars stable  Potassium 2.1  Troponin negative  Cr 0.31  WBC 11.3  Hgb 6.6. Platelets 218  Lactic acid normal  Scheduled Meds: . amLODipine  5 mg Oral Daily  . atorvastatin  20 mg Oral q1800  . carbamazepine  400 mg Oral BID  . cefTRIAXone (ROCEPHIN)  IV  1 g Intravenous Q24H  . enalapril  5 mg Oral Daily  . famotidine  10 mg Oral Daily  . feeding supplement (PRO-STAT SUGAR FREE  64)  30 mL Oral Daily  . insulin aspart  0-9 Units Subcutaneous TID WC  . latanoprost  1 drop Both Eyes QHS  . LORazepam  0.5 mg Oral BID  . mirtazapine  15 mg Oral QHS  . multivitamin with minerals  1 tablet Oral Daily  . olopatadine  1 drop Both Eyes Q12H  . polyethylene glycol  17 g Oral BID  . potassium chloride  40 mEq Oral Q4H  . risperiDONE  1 mg Oral QHS  . senna  1 tablet Oral QHS   Continuous Infusions: . heparin 1,050 Units/hr (12/10/15 1524)    Principal Problem:   Gross hematuria Active Problems:   Fecal impaction (HCC)   Dementia   Bedridden   Blood clot in bladder   Deep vein thrombosis (DVT) of iliac vein of left lower extremity (HCC)   Urinary tract infectious disease   Acute blood loss anemia   LOS: 1 day   Time spent 25 minutes   By signing my name below, I, Adron Bene, attest that this documentation has been prepared under the direction and in the presence of Daniel P. Irene Limbo, MD. Electronically Signed: Adron Bene, Scribe.  12/11/2015 11:00am  I personally performed the services described in this documentation. All medical record entries made by the scribe were at my direction. I have reviewed the chart and agree that the record reflects my personal performance and is accurate and complete. Brendia Sacks, MD

## 2015-12-12 LAB — CBC
HCT: 27.6 % — ABNORMAL LOW (ref 36.0–46.0)
Hemoglobin: 9.6 g/dL — ABNORMAL LOW (ref 12.0–15.0)
MCH: 31.5 pg (ref 26.0–34.0)
MCHC: 34.8 g/dL (ref 30.0–36.0)
MCV: 90.5 fL (ref 78.0–100.0)
PLATELETS: 207 10*3/uL (ref 150–400)
RBC: 3.05 MIL/uL — AB (ref 3.87–5.11)
RDW: 14.9 % (ref 11.5–15.5)
WBC: 10.9 10*3/uL — ABNORMAL HIGH (ref 4.0–10.5)

## 2015-12-12 LAB — BASIC METABOLIC PANEL
Anion gap: 4 — ABNORMAL LOW (ref 5–15)
BUN: 12 mg/dL (ref 6–20)
CALCIUM: 7.9 mg/dL — AB (ref 8.9–10.3)
CO2: 22 mmol/L (ref 22–32)
CREATININE: 0.31 mg/dL — AB (ref 0.44–1.00)
Chloride: 108 mmol/L (ref 101–111)
GFR calc non Af Amer: >60 mL/min (ref >60)
Glucose, Bld: 173 mg/dL — ABNORMAL HIGH (ref 65–99)
Potassium: 3.3 mmol/L — ABNORMAL LOW (ref 3.5–5.1)
SODIUM: 134 mmol/L — AB (ref 135–145)

## 2015-12-12 LAB — TYPE AND SCREEN
ABO/RH(D): AB POS
ANTIBODY SCREEN: NEGATIVE
UNIT DIVISION: 0
UNIT DIVISION: 0

## 2015-12-12 LAB — PHOSPHORUS: Phosphorus: 1.7 mg/dL — ABNORMAL LOW (ref 2.5–4.6)

## 2015-12-12 LAB — GLUCOSE, CAPILLARY
GLUCOSE-CAPILLARY: 127 mg/dL — AB (ref 65–99)
Glucose-Capillary: 159 mg/dL — ABNORMAL HIGH (ref 65–99)
Glucose-Capillary: 183 mg/dL — ABNORMAL HIGH (ref 65–99)

## 2015-12-12 LAB — MAGNESIUM: MAGNESIUM: 1.4 mg/dL — AB (ref 1.7–2.4)

## 2015-12-12 LAB — HEPARIN LEVEL (UNFRACTIONATED): Heparin Unfractionated: 0.12 IU/mL — ABNORMAL LOW (ref 0.30–0.70)

## 2015-12-12 MED ORDER — WARFARIN SODIUM 2.5 MG PO TABS: 2.5000 mg | ORAL_TABLET | Freq: Every day | ORAL | Status: AC

## 2015-12-12 MED ORDER — MAGNESIUM SULFATE 2 GM/50ML IV SOLN
2.0000 g | Freq: Once | INTRAVENOUS | Status: AC
  Administered 2015-12-12: 2 g via INTRAVENOUS
  Filled 2015-12-12: qty 50

## 2015-12-12 MED ORDER — K PHOS MONO-SOD PHOS DI & MONO 155-852-130 MG PO TABS
500.0000 mg | ORAL_TABLET | Freq: Three times a day (TID) | ORAL | Status: DC
  Administered 2015-12-12 (×2): 500 mg via ORAL
  Filled 2015-12-12 (×6): qty 2

## 2015-12-12 MED ORDER — ENOXAPARIN SODIUM 80 MG/0.8ML ~~LOC~~ SOLN
80.0000 mg | SUBCUTANEOUS | Status: DC
  Administered 2015-12-12: 80 mg via SUBCUTANEOUS
  Filled 2015-12-12: qty 0.8

## 2015-12-12 MED ORDER — WARFARIN - PHARMACIST DOSING INPATIENT: Freq: Every day | Status: DC

## 2015-12-12 MED ORDER — K PHOS MONO-SOD PHOS DI & MONO 155-852-130 MG PO TABS: 500.0000 mg | ORAL_TABLET | Freq: Three times a day (TID) | ORAL | Status: AC

## 2015-12-12 MED ORDER — CEFUROXIME AXETIL 250 MG PO TABS: 500.0000 mg | ORAL_TABLET | Freq: Two times a day (BID) | ORAL | Status: DC

## 2015-12-12 MED ORDER — APIXABAN 5 MG PO TABS: 5.0000 mg | ORAL_TABLET | Freq: Two times a day (BID) | ORAL | Status: DC

## 2015-12-12 MED ORDER — WARFARIN SODIUM 5 MG PO TABS
2.5000 mg | ORAL_TABLET | Freq: Once | ORAL | Status: AC
  Administered 2015-12-12: 2.5 mg via ORAL
  Filled 2015-12-12: qty 1

## 2015-12-12 MED ORDER — APIXABAN 5 MG PO TABS: 10.0000 mg | ORAL_TABLET | Freq: Two times a day (BID) | ORAL | Status: DC

## 2015-12-12 MED ORDER — CEFUROXIME AXETIL 500 MG PO TABS: 500.0000 mg | ORAL_TABLET | Freq: Two times a day (BID) | ORAL | Status: AC

## 2015-12-12 MED ORDER — HEPARIN BOLUS VIA INFUSION
1500.0000 [IU] | Freq: Once | INTRAVENOUS | Status: AC
  Administered 2015-12-12: 1500 [IU] via INTRAVENOUS
  Filled 2015-12-12: qty 1500

## 2015-12-12 MED ORDER — ENOXAPARIN SODIUM 80 MG/0.8ML ~~LOC~~ SOLN: 80.0000 mg | SUBCUTANEOUS | Status: AC

## 2015-12-12 MED ORDER — POLYETHYLENE GLYCOL 3350 17 G PO PACK: 17.0000 g | PACK | Freq: Two times a day (BID) | ORAL | Status: AC

## 2015-12-12 NOTE — Progress Notes (Signed)
Report called to [facility] 

## 2015-12-12 NOTE — [place] FL2 (Signed)
[place] MEDICAID FL2 LEVEL OF CARE SCREENING TOOL     IDENTIFICATION  Patient Name: Melanie Baker Birthdate: Apr 16, 1933 Sex: female Admission Date (Current Location): 12/08/2015  [place]:  [place] [id number] N Facility and Address:  [facility],  [address], [place] [id number]      Provider Number: [id number]  Attending Physician Name and Address:  Standley Brookinganiel P Goodrich, MD  Relative Name and Phone Number:       Current Level of Care: Hospital Recommended Level of Care: Skilled Nursing Facility Prior Approval Number:    Date Approved/Denied:   PASRR Number: [id number] [id number]  Discharge Plan: SNF    Current Diagnoses: Patient Active Problem List   Diagnosis Date Noted  . Acute blood loss anemia 12/11/2015  . Gross hematuria 12/08/2015  . Fecal impaction (HCC) 12/08/2015  . Dementia 12/08/2015  . Bedridden 12/08/2015  . Blood clot in bladder   . Deep vein thrombosis (DVT) of iliac vein of left lower extremity (HCC)   . Urinary tract infectious disease     Orientation RESPIRATION BLADDER Height & Weight     Self  Normal Incontinent Weight: 121 lb 4.8 oz (55.021 kg) Height:  5\' 7"  (170.2 cm)  BEHAVIORAL SYMPTOMS/MOOD NEUROLOGICAL BOWEL NUTRITION STATUS     (n/a) Incontinent Diet (Dysphagia 1 with thin liquids. )  AMBULATORY STATUS COMMUNICATION OF NEEDS Skin   Total Care Verbally Other (Comment) (Moisture associated skin damage to sacrum with foam dressing)                       Personal Care Assistance Level of Assistance  Bathing, Feeding, Dressing Bathing Assistance: Maximum assistance Feeding assistance: Maximum assistance Dressing Assistance: Maximum assistance     Functional Limitations Info  Sight, Hearing, Speech Sight Info: Adequate Hearing Info: Adequate Speech Info: Adequate    SPECIAL CARE FACTORS FREQUENCY                       Contractures      Additional Factors Info  Code Status, Allergies,  Psychotropic Code Status Info: DNR Allergies Info: No known allergies Psychotropic Info: Risperdal, Ativan         Current Medications (12/12/2015):  This is the current hospital active medication list Current Facility-Administered Medications  Medication Dose Route Frequency Provider Last Rate Last Dose  . acetaminophen (TYLENOL) tablet 500 mg  500 mg Oral Q8H PRN Delano Metzobert Schertz, MD      . amLODipine (NORVASC) tablet 5 mg  5 mg Oral Daily Delano Metzobert Schertz, MD   5 mg at 12/12/15 0931  . atorvastatin (LIPITOR) tablet 20 mg  20 mg Oral q1800 Delano Metzobert Schertz, MD   20 mg at 12/11/15 1717  . carbamazepine (TEGRETOL) tablet 400 mg  400 mg Oral BID Delano Metzobert Schertz, MD   400 mg at 12/12/15 0931  . [START ON 12/13/2015] cefUROXime (CEFTIN) tablet 500 mg  500 mg Oral BID WC Standley Brookinganiel P Goodrich, MD      . enalapril (VASOTEC) tablet 5 mg  5 mg Oral Daily Delano Metzobert Schertz, MD   5 mg at 12/12/15 0931  . famotidine (PEPCID) tablet 10 mg  10 mg Oral Daily Delano Metzobert Schertz, MD   10 mg at 12/12/15 0931  . feeding supplement (PRO-STAT SUGAR FREE 64) liquid 30 mL  30 mL Oral Daily Delano Metzobert Schertz, MD   30 mL at 12/12/15 0932  . heparin ADULT infusion 100 units/mL (25000 units/250 mL)  1,200 Units/hr Intravenous  Continuous Standley Brooking, MD 12 mL/hr at 12/12/15 0926 1,200 Units/hr at 12/12/15 0926  . insulin aspart (novoLOG) injection 0-9 Units  0-9 Units Subcutaneous TID WC Standley Brooking, MD   2 Units at 12/12/15 1209  . latanoprost (XALATAN) 0.005 % ophthalmic solution 1 drop  1 drop Both Eyes QHS Delano Metz, MD   1 drop at 12/11/15 2141  . LORazepam (ATIVAN) tablet 0.5 mg  0.5 mg Oral BID Delano Metz, MD   0.5 mg at 12/12/15 0932  . mirtazapine (REMERON) tablet 15 mg  15 mg Oral QHS Delano Metz, MD   15 mg at 12/11/15 2137  . multivitamin with minerals tablet 1 tablet  1 tablet Oral Daily Delano Metz, MD   1 tablet at 12/12/15 0931  . olopatadine (PATANOL) 0.1 % ophthalmic solution 1 drop  1 drop Both  Eyes Q12H Delano Metz, MD   1 drop at 12/12/15 0932  . ondansetron (ZOFRAN) tablet 4 mg  4 mg Oral Q6H PRN Delano Metz, MD   4 mg at 12/10/15 2123   Or  . ondansetron (ZOFRAN) injection 4 mg  4 mg Intravenous Q6H PRN Delano Metz, MD      . phosphorus (K PHOS NEUTRAL) tablet 500 mg  500 mg Oral TID Standley Brooking, MD   500 mg at 12/12/15 1009  . polyethylene glycol (MIRALAX / GLYCOLAX) packet 17 g  17 g Oral BID Standley Brooking, MD   17 g at 12/12/15 0932  . risperiDONE (RISPERDAL) tablet 1 mg  1 mg Oral QHS Delano Metz, MD   1 mg at 12/11/15 2137  . senna (SENOKOT) tablet 8.6 mg  1 tablet Oral QHS Delano Metz, MD   8.6 mg at 12/11/15 2200     Discharge Medications: Please see discharge summary for a list of discharge medications.  Relevant Imaging Results:  Relevant Lab Results:   Additional Information SS#: [id number]  [place], [place] [phone]

## 2015-12-12 NOTE — Clinical Social Work Note (Signed)
[facility] Pt's daughter, Clydie BraunKaren and facility aware and agreeable. Will transfer via facility van later this afternoon.   Derenda FennelKara Dontae Minerva, LCSW [phone]

## 2015-12-12 NOTE — Progress Notes (Signed)
PROGRESS NOTE  Melanie Baker BJY:[id number] DOB: 08-21-1932 DOA: 12/08/2015 PCP: No primary care provider on file.  Brief Narrative: 82 year old woman with history of advanced dementia, bedridden presented from skilled nursing facility with gross hematuria after insertion of a Foley and acute blood loss anemia. She was admitted for continuous bladder irrigation and urology evaluation. Also noted to have DVT. Her hgb was noted to drop to 6.6 on 5/14, so she was transfused pRBCs.  Assessment/Plan: 1. Gross hematuria. Resolved, no bleeding for greater than 48 hours. Thought secondary to infection. Anticoagulation okay per urology. 2. Acute blood loss anemia. Secondary to hematuria. No evidence of ongoing bleeding. Improved appropriately status post transfusion. Urine is clear. 3. Left lower extremity and iliac DVT. No bleeding on heparin, change to oral anticoagulation.  4. Hypokalemia, likely dietary in nature. Much improved. Telemetry sinus rhythm.  5. Low-grade fever. Lactic acid was normal. Leukocytosis continues to improve, now near normal. There is no evidence of sepsis. Likely secondary to DVT. 6. UTI, continue empiric treatment total 7 days. Urine culture is unrevealing. Blood cultures pending. 7. Fecal impaction. Resolved. Continue bowel regimen. 8. Advanced dementia. Stable. 9. Bedridden. 10. DM Type 2. Stable.    Overall stable, no bleeding, no recurrent hematuria. Hemoglobin improved appropriately with transfusion, infection appears well controlled, electrolytes improved.  Replete potassium, magnesium and phosphorus.   Transition to oral abx and anticoagulation  Transfer back to SNF today.   Brendia Sacks, MD  [facility] Direct contact:  --Via amion app OR  --[email or web address]; password [email or web address] and click  7PM-7AM contact night coverage as above 12/12/2015, 8:34 AM  LOS: 2 days   Consultants:  Urology   Procedures:  2 units PRBC transfusion  5/14  Antimicrobials:  Ceftriaxone 5/11>> 5/15  Ceftin 5/16 >> 5/17  HPI/Subjective:  No issues noted overnight. She denies any pain or trouble breathing. History is unreliable.  Objective: Filed Vitals:   12/11/15 1807 12/11/15 2000 12/11/15 2021 12/12/15 0455  BP: 105/88 126/95 110/97 120/71  Pulse: 107 103 63 102  Temp: 100.2 F (37.9 C) 99.2 F (37.3 C) 100.2 F (37.9 C) 96.8 F (36 C)  TempSrc: Oral Oral Oral Oral  Resp: Height:      Weight:      SpO2: 98% 96% 96% 99%    Intake/Output Summary (Last 24 hours) at 12/12/15 0834 Last data filed at 12/12/15 0300  Gross per 24 hour  Intake   1025 ml  Output   1850 ml  Net   -825 ml     Filed Weights   12/08/15 2033 12/09/15 0342 12/09/15 0527  Weight: 55.067 kg (121 lb 6.4 oz) 55.68 kg (122 lb 12 oz) 55.021 kg (121 lb 4.8 oz)    Exam: Constitutional:  . Appears calm and comfortable Respiratory:  . CTA bilaterally, no w/r/r.  . Respiratory effort normal. No retractions or accessory muscle use Cardiovascular:  . RRR, no m/r/g . 2+ pedal edema bliaterally   . Urine clear yellow.   I have personally reviewed following labs and imaging studies:  CBG Stable   Potassium 3.3  Mg 1.4  Phosphorus 1.7  Hemoglobin up to 9.6  Scheduled Meds: . amLODipine  5 mg Oral Daily  . atorvastatin  20 mg Oral q1800  . carbamazepine  400 mg Oral BID  . cefTRIAXone (ROCEPHIN)  IV  1 g Intravenous Q24H  . enalapril  5 mg Oral Daily  . famotidine  10 mg Oral Daily  .  feeding supplement (PRO-STAT SUGAR FREE 64)  30 mL Oral Daily  . insulin aspart  0-9 Units Subcutaneous TID WC  . latanoprost  1 drop Both Eyes QHS  . LORazepam  0.5 mg Oral BID  . magnesium sulfate 1 - 4 g bolus IVPB  2 g Intravenous Once  . mirtazapine  15 mg Oral QHS  . multivitamin with minerals  1 tablet Oral Daily  . olopatadine  1 drop Both Eyes Q12H  . phosphorus  500 mg Oral TID  . polyethylene glycol  17 g Oral BID  .  risperiDONE  1 mg Oral QHS  . senna  1 tablet Oral QHS   Continuous Infusions: . heparin 1,050 Units/hr (12/11/15 1717)    Principal Problem:   Gross hematuria Active Problems:   Fecal impaction (HCC)   Dementia   Bedridden   Blood clot in bladder   Deep vein thrombosis (DVT) of iliac vein of left lower extremity (HCC)   Urinary tract infectious disease   Acute blood loss anemia   LOS: 2 days    By signing my name below, I, Zadie CleverlyJessica Augustus attest that this documentation has been prepared under the direction and in the presence of Brendia Sacksaniel Taahir Grisby, MD Electronically signed: Zadie CleverlyJessica Augustus 12/12/2015   I personally performed the services described in this documentation. All medical record entries made by the scribe were at my direction. I have reviewed the chart and agree that the record reflects my personal performance and is accurate and complete. Brendia Sacksaniel Kalei Mckillop, MD

## 2015-12-12 NOTE — Progress Notes (Addendum)
ANTICOAGULATION CONSULT NOTE   Pharmacy Consult for lovenox and coumadin Indication: DVT  No Known Allergies  Patient Measurements: Height: 5\' 7"  (170.2 cm) Weight: 121 lb 4.8 oz (55.021 kg) IBW/kg (Calculated) : 61.6 Heparin Dosing Weight: 55kg  Vital Signs: Temp: 96.8 F (36 C) (05/15 0455) Temp Source: Oral (05/15 0455) BP: 120/71 mmHg (05/15 0455) Pulse Rate: 102 (05/15 0455)  Labs:  Recent Labs  12/10/15 1056 12/10/15 1656 12/11/15 0637 12/12/15 0655  HGB 8.2*  --  6.6* 9.6*  HCT 23.9*  --  19.8* 27.6*  PLT 232  --  218 207  HEPARINUNFRC  --  0.33 0.32 0.12*  CREATININE  --   --  0.31* 0.31*  TROPONINI  --   --  <0.03  --     Estimated Creatinine Clearance: 47.9 mL/min (by C-G formula based on Cr of 0.31).   Medical History: Past Medical History  Diagnosis Date  . Alzheimer's dementia uti  . UTI (urinary tract infection)   . Arthritis   . Renal disorder   . Dysphagia   . Anemia   . Gastro-esophageal reflux   . Diabetes mellitus without complication (HCC)   . Hypertension   . Bipolar 1 disorder (HCC)     Medications:  Prescriptions prior to admission  Medication Sig Dispense Refill Last Dose  . acetaminophen (TYLENOL) 500 MG tablet Take 500 mg by mouth every 8 (eight) hours as needed (osteoarthritis).   12/08/2015 at Unknown time  . Amino Acids-Protein Hydrolys (FEEDING SUPPLEMENT, PRO-STAT SUGAR FREE 64,) LIQD Take 30 mLs by mouth daily.   12/08/2015 at Unknown time  . amLODipine (NORVASC) 5 MG tablet Take 5 mg by mouth daily.   12/08/2015 at Unknown time  . atorvastatin (LIPITOR) 20 MG tablet Take 20 mg by mouth daily.   12/08/2015 at Unknown time  . bimatoprost (LUMIGAN) 0.01 % SOLN Place 1 drop into both eyes at bedtime.   12/07/2015 at Unknown time  . carbamazepine (TEGRETOL) 200 MG tablet Take 400 mg by mouth 2 (two) times daily.   12/08/2015 at Unknown time  . enalapril (VASOTEC) 5 MG tablet Take 5 mg by mouth daily.   12/08/2015 at Unknown time  .  levofloxacin (LEVAQUIN) 500 MG tablet Take 500 mg by mouth daily.   12/07/2015 at Unknown time  . LORazepam (ATIVAN) 0.5 MG tablet Take one tablet by mouth twice daily 60 tablet 5 12/08/2015 at Unknown time  . metFORMIN (GLUCOPHAGE) 500 MG tablet Take 500 mg by mouth 2 (two) times daily with a meal.   12/08/2015 at Unknown time  . mirtazapine (REMERON) 15 MG tablet Take 15 mg by mouth at bedtime.   12/07/2015 at Unknown time  . Multiple Vitamins-Minerals (MULTIVITAMIN WITH MINERALS) tablet Take 1 tablet by mouth daily.   12/08/2015 at Unknown time  . olopatadine (PATANOL) 0.1 % ophthalmic solution Place 1 drop into both eyes every 12 (twelve) hours.   12/08/2015 at Unknown time  . ranitidine (ZANTAC) 75 MG tablet Take 75 mg by mouth at bedtime.   12/07/2015 at Unknown time  . risperiDONE (RISPERDAL) 1 MG tablet Take 1 mg by mouth at bedtime.   12/07/2015 at Unknown time  . senna (SENOKOT) 8.6 MG TABS tablet Take 1 tablet by mouth at bedtime.   12/07/2015 at Unknown time  . aspirin 81 MG chewable tablet Chew 81 mg by mouth daily.   12/07/2015  . docusate sodium (COLACE) 100 MG capsule Take 100 mg by mouth daily.  12/06/2015    Assessment: 82 yo female admitted on 12/08/2015 with UTI and gross hematuria. Hgb 8.8. SHe underwent CT which showed no GU abnormalities except clot in the bladder. She was found to have a DVT and started on heparin now transitioning to lovenox and coumadin. (unable to do apixaban because pt on carbamazepine, which is a strong inducer,  and is not recommended) Will bridge coumadin with lovenox.   Goal of Therapy:  Goal INR 2-3 Monitor platelets by anticoagulation protocol: Yes   Plan:  Coumadin 2.5mg  daily Lovenox 1.5mg /kg q24h ( ) sq Daily PT/INR Continue to monitor H&H and platelets Monitor for S/S of bleeding  Elder Cyphers, BS Loura Back, BCPS Clinical Pharmacist Pager [phone] 12/12/2015,12:26 PM

## 2015-12-12 NOTE — Discharge Summary (Addendum)
Physician Discharge Summary  Melanie Baker ZOX:[id number] DOB: 02-Sep-1932 DOA: 12/08/2015  PCP: No primary care provider on file. [facility] physician  Admit date: 12/08/2015 Discharge date: 12/12/2015  Recommendations for Outpatient Follow-up:  1. Follow-up left lower extremity DVT, started on Warfarin with Lovenox bridge 5/15 (stop Lovenox when warfarin therapeutic 2-3 INR. Not a candidate for Eliquis or Xarelto secondary to Tegretol. Would suggest 3-6 months treatment 2. Follow-up periodic CBC given acute blood loss anemia 3. Gross hematuria resolved, no follow-up suggested at this time   Discharge Diagnoses:  1. Gross hematuria 2. Acute blood loss anemia 3. Left lower extremity and iliac DVT 4. UTI 5. Fecal impaction 6. Advanced dementia 7. Bedridden 8. DM type 2 9. Hypokalemia, hypomagnesemia, hypophosphatemia  Discharge Condition: Improved Disposition: Return to SNF  Diet recommendation: Dysphagia 1 diet  Filed Weights   12/08/15 2033 12/09/15 0342 12/09/15 0527  Weight: 55.067 kg (121 lb 6.4 oz) 55.68 kg (122 lb 12 oz) 55.021 kg (121 lb 4.8 oz)    History of present illness:  82 year old woman with history of advanced dementia, bedridden presented from skilled nursing facility with gross hematuria after insertion of a Foley and acute blood loss anemia. She was admitted for continuous bladder irrigation and urology evaluation. Also noted to have DVT. Her hgb was noted to drop to 6.6 on 5/14, so she was transfused pRBCs.  Hospital Course:  Urology followed the patient, treated with continuous bladder irrigation with resolution of hematuria. Hematuria felt to be secondary to infection and no further evaluation was suggested. Urine culture was unrevealing and patient was treated empirically with antibiotics. Irrigation was discontinued in the patient has had no bleeding. Hemoglobin trended down consistent with significant blood loss and dilution. She received 2 units packed red  blood cells with appropriate response in hemoglobin. Her urine remains clear this time. DVT was treated with IV heparin and will be converted to oral anticoagulation today on discharge. Hospitalization was uncomplicated. Individual issues as below.  Individual issues as below:  1. Gross hematuria. Resolved, no bleeding for greater than 48 hours. Thought secondary to infection. Anticoagulation okay per urology. 2. Acute blood loss anemia. Secondary to hematuria. No evidence of ongoing bleeding. Improved appropriately status post transfusion. Urine is clear. 3. Left lower extremity and iliac DVT. No bleeding on heparin, change to oral anticoagulation.  4. Hypokalemia, likely dietary in nature. Much improved. Telemetry sinus rhythm.  5. Low-grade fever. Lactic acid was normal. Leukocytosis continues to improve, now near normal. There is no evidence of sepsis. Likely secondary to DVT. 6. UTI, continue empiric treatment total 7 days. Urine culture is unrevealing. Blood cultures pending. 7. Fecal impaction. Resolved. Continue bowel regimen. 8. Advanced dementia. Stable. 9. Bedridden. 10. DM Type 2. Stable.  Consultants:  Urology  Procedures:  2UNITS PRBC transfusion 5/14 Discharge Instructions   Current Discharge Medication List    START taking these medications   Details  cefUROXime (CEFTIN) 500 MG tablet Take 1 tablet (500 mg total) by mouth 2 (two) times daily with a meal. Start 5/16. Qty: 4 tablet, Refills: 0    enoxaparin (LOVENOX) 80 MG/0.8ML injection Inject 0.8 mLs (80 mg total) into the skin daily.    phosphorus (K PHOS NEUTRAL) [phone] MG tablet Take 2 tablets (500 mg total) by mouth 3 (three) times daily. Stop 5/16 in the evening.    polyethylene glycol (MIRALAX / GLYCOLAX) packet Take 17 g by mouth 2 (two) times daily.    warfarin (COUMADIN) 2.5 MG tablet Take  1 tablet (2.5 mg total) by mouth daily at 6 PM.      CONTINUE these medications which have NOT CHANGED    Details  acetaminophen (TYLENOL) 500 MG tablet Take 500 mg by mouth every 8 (eight) hours as needed (osteoarthritis).    Amino Acids-Protein Hydrolys (FEEDING SUPPLEMENT, PRO-STAT SUGAR FREE 64,) LIQD Take 30 mLs by mouth daily.    amLODipine (NORVASC) 5 MG tablet Take 5 mg by mouth daily.    atorvastatin (LIPITOR) 20 MG tablet Take 20 mg by mouth daily.    bimatoprost (LUMIGAN) 0.01 % SOLN Place 1 drop into both eyes at bedtime.    carbamazepine (TEGRETOL) 200 MG tablet Take 400 mg by mouth 2 (two) times daily.    enalapril (VASOTEC) 5 MG tablet Take 5 mg by mouth daily.    LORazepam (ATIVAN) 0.5 MG tablet Take one tablet by mouth twice daily Qty: 60 tablet, Refills: 5    metFORMIN (GLUCOPHAGE) 500 MG tablet Take 500 mg by mouth 2 (two) times daily with a meal.    mirtazapine (REMERON) 15 MG tablet Take 15 mg by mouth at bedtime.    Multiple Vitamins-Minerals (MULTIVITAMIN WITH MINERALS) tablet Take 1 tablet by mouth daily.    olopatadine (PATANOL) 0.1 % ophthalmic solution Place 1 drop into both eyes every 12 (twelve) hours.    ranitidine (ZANTAC) 75 MG tablet Take 75 mg by mouth at bedtime.    risperiDONE (RISPERDAL) 1 MG tablet Take 1 mg by mouth at bedtime.    senna (SENOKOT) 8.6 MG TABS tablet Take 1 tablet by mouth at bedtime.      STOP taking these medications     levofloxacin (LEVAQUIN) 500 MG tablet      aspirin 81 MG chewable tablet      docusate sodium (COLACE) 100 MG capsule        No Known Allergies  The results of significant diagnostics from this hospitalization (including imaging, microbiology, ancillary and laboratory) are listed below for reference.    Significant Diagnostic Studies: Ct Abdomen Pelvis W Contrast  12/08/2015  CLINICAL DATA:  Hematuria.  Dementia. EXAM: CT ABDOMEN AND PELVIS WITH CONTRAST TECHNIQUE: Multidetector CT imaging of the abdomen and pelvis was performed using the standard protocol following bolus administration of  intravenous contrast. CONTRAST:  75mL ISOVUE-300 IOPAMIDOL (ISOVUE-300) INJECTION 61% COMPARISON:  None. FINDINGS: Lower chest and abdominal wall:  Atelectasis in the lower lobes. Hepatobiliary: Upper right liver cyst with simple appearance.Cholecystectomy with normal common bile duct. Pancreas: Unremarkable. Spleen: Unremarkable. Adrenals/Urinary Tract: Negative adrenals. Fullness of the bilateral urinary collecting system which is likely from poor bladder drainage. Left hilar calcification appears vascular on reformats. No urolithiasis suspected. Simple right renal cyst. Bladder is partially decompressed by a well-positioned Foley catheter. Lumen contains heterogeneous high density material consistent with clot. Diffuse bladder wall thickening without gross bladder wall mass, although limited by the degree of hematoma. No enhancement seen along the upper urothelium. Reproductive:Probable atrophic uterus which is deviated to the left. Stomach/Bowel: The rectum is distended by stool to 10 cm. There is congestion in the mesial rectal fat and mild wall thickening. Large stool burden in the left colon. No small bowel obstruction. Negative appendix. Vascular/Lymphatic: Although early in the venous phase there is asymmetric low-density appearance in the left common and external iliac artery, patchy in the common femoral artery. There is asymmetric left lower extremity edema seen at the thigh. Findings are convincing for DVT, chronicity indeterminate due to lack of vessel expansion. No  mass or adenopathy. Peritoneal: No ascites or pneumoperitoneum. Musculoskeletal: No acute abnormalities. IMPRESSION: 1. Moderate volume blood clot in the otherwise decompressed bladder. Nonspecific bladder wall thickening without gross mass lesion. No upper tract source of hematuria identified. 2. Rectal impaction with developing stercoral colitis. 3. Left iliac DVT. Electronically Signed   By: Marnee Spring M.D.   On: 12/08/2015 17:18    US Venous Img Lower Bilateral  12/10/2015  CLINICAL DATA:  Left iliac DVT by CT 12/08/2015 EXAM: BILATERAL LOWER EXTREMITY VENOUS DOPPLER ULTRASOUND TECHNIQUE: Gray-scale sonography with graded compression, as well as color Doppler and duplex ultrasound were performed to evaluate the lower extremity deep venous systems from the level of the common femoral vein and including the common femoral, femoral, profunda femoral, popliteal and calf veins including the posterior tibial, peroneal and gastrocnemius veins when visible. The superficial great saphenous vein was also interrogated. Spectral Doppler was utilized to evaluate flow at rest and with distal augmentation maneuvers in the common femoral, femoral and popliteal veins. COMPARISON:  12/08/2015 FINDINGS: RIGHT LOWER EXTREMITY Common Femoral Vein: No evidence of thrombus. Normal compressibility, respiratory phasicity and response to augmentation. Saphenofemoral Junction: No evidence of thrombus. Normal compressibility and flow on color Doppler imaging. Profunda Femoral Vein: No evidence of thrombus. Normal compressibility and flow on color Doppler imaging. Femoral Vein: No evidence of thrombus. Normal compressibility, respiratory phasicity and response to augmentation. Popliteal Vein: No evidence of thrombus. Normal compressibility, respiratory phasicity and response to augmentation. Calf Veins: Very limited assessment of the tibial and peroneal veins because of peripheral edema. Superficial Great Saphenous Vein: No evidence of thrombus. Normal compressibility and flow on color Doppler imaging. Venous Reflux:  None. Other Findings:  None. LEFT LOWER EXTREMITY Hypoechoic intraluminal thrombus within the left common femoral vein, extending into the saphenous femoral junction, and also within the left femoral vein. Components of the left femoral DVT appear nearly occlusive. Popliteal vein remains patent. Tibial veins not well assessed because of peripheral  edema. Other Findings:  None. IMPRESSION: Positive exam for left lower extremity DVT within the left common femoral and femoral veins with some extension into the saphenous femoral junction. Femoral DVT appears nearly occlusive. Thrombus does not propagate into the left popliteal vein. Negative for right lower extremity DVT. Electronically Signed   By: Judie Petit.  Shick M.D.   On: 12/10/2015 10:52    Microbiology: Recent Results (from the past 240 hour(s))  Urine culture     Status: Abnormal   Collection Time: 12/08/15  3:00 PM  Result Value Ref Range Status   Specimen Description URINE, CATHETERIZED  Final   Special Requests NONE  Final   Culture MULTIPLE SPECIES PRESENT, SUGGEST RECOLLECTION (A)  Final   Report Status 12/10/2015 FINAL  Final  MRSA PCR Screening     Status: None   Collection Time: 12/09/15  1:12 AM  Result Value Ref Range Status   MRSA by PCR NEGATIVE NEGATIVE Final    Comment:        The GeneXpert MRSA Assay (FDA approved for NASAL specimens only), is one component of a comprehensive MRSA colonization surveillance program. It is not intended to diagnose MRSA infection nor to guide or monitor treatment for MRSA infections.   Culture, blood (routine x 2)     Status: None (Preliminary result)   Collection Time: 12/11/15  6:37 AM  Result Value Ref Range Status   Specimen Description BLOOD LEFT HAND  Final   Special Requests BOTTLES DRAWN AEROBIC AND ANAEROBIC 10CC EACH  Final   Culture NO GROWTH 1 DAY  Final   Report Status PENDING  Incomplete  Culture, blood (routine x 2)     Status: None (Preliminary result)   Collection Time: 12/11/15  6:47 AM  Result Value Ref Range Status   Specimen Description BLOOD RIGHT HAND  Final   Special Requests BOTTLES DRAWN AEROBIC AND ANAEROBIC 10CC EACH  Final   Culture NO GROWTH 1 DAY  Final   Report Status PENDING  Incomplete     Labs: Basic Metabolic Panel:  Recent Labs Lab 12/08/15 1435 12/09/15 0548 12/11/15 0637  12/12/15 0655  NA 135 135 135 134*  K 4.2 3.2* 2.1* 3.3*  CL 100* 98* 108 108  CO2 26 28 23 22   GLUCOSE 107* 134* 137* 173*  BUN 14 11 15 12   CREATININE 0.34* 0.32* 0.31* 0.31*  CALCIUM 9.1 9.1 7.5* 7.9*  MG  --   --   --  1.4*  PHOS  --   --   --  1.7*   Liver Function Tests:  Recent Labs Lab 12/11/15 0637  AST 20  ALT 15  ALKPHOS 56  BILITOT 0.3  PROT 5.1*  ALBUMIN 2.3*  CBC:  Recent Labs Lab 12/08/15 1435 12/09/15 0548 12/10/15 0609 12/10/15 1056 12/11/15 0637 12/12/15 0655  WBC 6.2 7.4 12.4* 16.3* 11.3* 10.9*  NEUTROABS 4.0  --   --   --  9.5*  --   HGB 8.4* 8.8* 7.5* 8.2* 6.6* 9.6*  HCT 25.0* 25.6* 22.0* 23.9* 19.8* 27.6*  MCV 95.4 95.9 95.2 96.0 94.7 90.5  PLT 223 228 237 232 218 207   Cardiac Enzymes:  Recent Labs Lab 12/11/15 0637  TROPONINI <0.03   CBG:  Recent Labs Lab 12/11/15 1146 12/11/15 1641 12/11/15 2031 12/12/15 0827 12/12/15 1140  GLUCAP 149* 147* 154* 159* 183*    Principal Problem:   Gross hematuria Active Problems:   Fecal impaction (HCC)   Dementia   Bedridden   Blood clot in bladder   Deep vein thrombosis (DVT) of iliac vein of left lower extremity (HCC)   Urinary tract infectious disease   Acute blood loss anemia   Time coordinating discharge: 35 minutes  Signed:  Brendia Sacksaniel Chairty Toman, MD [facility] 12/12/2015, 1:29 PM   By signing my name below, I, Zadie CleverlyJessica Augustus attest that this documentation has been prepared under the direction and in the presence of Brendia Sacksaniel Atziry Baranski, MD Electronically signed: Zadie CleverlyJessica Augustus    I personally performed the services described in this documentation. All medical record entries made by the scribe were at my direction. I have reviewed the chart and agree that the record reflects my personal performance and is accurate and complete. Brendia Sacksaniel Caron Tardif, MD

## 2015-12-12 NOTE — Progress Notes (Signed)
ANTICOAGULATION CONSULT NOTE   Pharmacy Consult for Heparin Indication: DVT  No Known Allergies  Patient Measurements: Height:  (170.2 cm) Weight: 121 lb 4.8 oz (55.021 kg) IBW/kg (Calculated) : 61.6 Heparin Dosing Weight: 55kg  Vital Signs: Temp: 96.8 F (36 C) (05/15 0455) Temp Source: Oral (05/15 0455) BP: 120/71 mmHg (05/15 0455) Pulse Rate: 102 (05/15 0455)  Labs:  Recent Labs  12/10/15 1056 12/10/15 1656 12/11/15 0637 12/12/15 0655  HGB 8.2*  --  6.6* 9.6*  HCT 23.9*  --  19.8* 27.6*  PLT 232  --  218 207  HEPARINUNFRC  --  0.33 0.32 0.12*  CREATININE  --   --  0.31* 0.31*  TROPONINI  --   --  <0.03  --     Estimated Creatinine Clearance: 47.9 mL/min (by C-G formula based on Cr of 0.31).   Medical History: Past Medical History  Diagnosis Date  . Alzheimer's dementia uti  . UTI (urinary tract infection)   . Arthritis   . Renal disorder   . Dysphagia   . Anemia   . Gastro-esophageal reflux   . Diabetes mellitus without complication (HCC)   . Hypertension   . Bipolar 1 disorder (HCC)     Medications:  Prescriptions prior to admission  Medication Sig Dispense Refill Last Dose  . acetaminophen (TYLENOL) 500 MG tablet Take 500 mg by mouth every 8 (eight) hours as needed (osteoarthritis).   12/08/2015 at Unknown time  . Amino Acids-Protein Hydrolys (FEEDING SUPPLEMENT, PRO-STAT SUGAR FREE 64,) LIQD Take 30 mLs by mouth daily.   12/08/2015 at Unknown time  . amLODipine (NORVASC) 5 MG tablet Take 5 mg by mouth daily.   12/08/2015 at Unknown time  . atorvastatin (LIPITOR) 20 MG tablet Take 20 mg by mouth daily.   12/08/2015 at Unknown time  . bimatoprost (LUMIGAN) 0.01 % SOLN Place 1 drop into both eyes at bedtime.   12/07/2015 at Unknown time  . carbamazepine (TEGRETOL) 200 MG tablet Take 400 mg by mouth 2 (two) times daily.   12/08/2015 at Unknown time  . enalapril (VASOTEC) 5 MG tablet Take 5 mg by mouth daily.   12/08/2015 at Unknown time  . levofloxacin  (LEVAQUIN) 500 MG tablet Take 500 mg by mouth daily.   12/07/2015 at Unknown time  . LORazepam (ATIVAN) 0.5 MG tablet Take one tablet by mouth twice daily 60 tablet 5 12/08/2015 at Unknown time  . metFORMIN (GLUCOPHAGE) 500 MG tablet Take 500 mg by mouth 2 (two) times daily with a meal.   12/08/2015 at Unknown time  . mirtazapine (REMERON) 15 MG tablet Take 15 mg by mouth at bedtime.   12/07/2015 at Unknown time  . Multiple Vitamins-Minerals (MULTIVITAMIN WITH MINERALS) tablet Take 1 tablet by mouth daily.   12/08/2015 at Unknown time  . olopatadine (PATANOL) 0.1 % ophthalmic solution Place 1 drop into both eyes every 12 (twelve) hours.   12/08/2015 at Unknown time  . ranitidine (ZANTAC) 75 MG tablet Take 75 mg by mouth at bedtime.   12/07/2015 at Unknown time  . risperiDONE (RISPERDAL) 1 MG tablet Take 1 mg by mouth at bedtime.   12/07/2015 at Unknown time  . senna (SENOKOT) 8.6 MG TABS tablet Take 1 tablet by mouth at bedtime.   12/07/2015 at Unknown time  . aspirin 81 MG chewable tablet Chew 81 mg by mouth daily.   12/07/2015  . docusate sodium (COLACE) 100 MG capsule Take 100 mg by mouth daily.   12/06/2015  Assessment: [age] yo female admitted on 12/08/2015 with UTI and gross hematuria. Hgb 8.8. SHe underwent CT which showed no GU abnormalities except clot in the bladder. She was found to have a DVT and started on heparin. Heparin level 0.12, sutherapeutic now after transfusion.  hgb has increased to 9.6.  Adjust heparin  Goal of Therapy:  Heparin level 0.3-0.7 units/ml Monitor platelets by anticoagulation protocol: Yes   Plan:  Bolus 1500 units, then Increase heparin infusion at 1200 units/hr Check anti-Xa level in 8 hours and daily while on heparin Continue to monitor H&H and platelets  Elder CyphersLorie Eyana Stolze, BS Loura BackPharm D, BCPS Clinical Pharmacist Pager [phone] 12/12/2015,9:20 AM

## 2015-12-12 NOTE — Care Management Important Message (Signed)
Important Message  Patient Details  Name: Melanie Baker MRN: [id number] Date of Birth: Jun 23, 1933   Medicare Important Message Given:  Yes    Malcolm MetroChildress, Joziah Dollins Demske, RN 12/12/2015, 12:36 PM

## 2015-12-12 NOTE — Progress Notes (Signed)
Lab called with gram+ cocci clusters blood culture, md aware.

## 2015-12-13 ENCOUNTER — Telehealth: Payer: Self-pay | Admitting: Family Medicine

## 2015-12-13 LAB — BLOOD CULTURE ID PANEL (REFLEXED)
Acinetobacter baumannii: NOT DETECTED
CANDIDA ALBICANS: NOT DETECTED
CANDIDA GLABRATA: NOT DETECTED
CANDIDA KRUSEI: NOT DETECTED
CANDIDA TROPICALIS: NOT DETECTED
Candida parapsilosis: NOT DETECTED
Carbapenem resistance: NOT DETECTED
ENTEROBACTER CLOACAE COMPLEX: NOT DETECTED
ESCHERICHIA COLI: NOT DETECTED
Enterobacteriaceae species: NOT DETECTED
Enterococcus species: NOT DETECTED
HAEMOPHILUS INFLUENZAE: NOT DETECTED
KLEBSIELLA OXYTOCA: NOT DETECTED
Klebsiella pneumoniae: NOT DETECTED
Listeria monocytogenes: NOT DETECTED
METHICILLIN RESISTANCE: DETECTED — AB
NEISSERIA MENINGITIDIS: NOT DETECTED
PROTEUS SPECIES: NOT DETECTED
Pseudomonas aeruginosa: NOT DETECTED
STREPTOCOCCUS PNEUMONIAE: NOT DETECTED
STREPTOCOCCUS PYOGENES: NOT DETECTED
STREPTOCOCCUS SPECIES: NOT DETECTED
Serratia marcescens: NOT DETECTED
Staphylococcus aureus (BCID): NOT DETECTED
Staphylococcus species: DETECTED — AB
Streptococcus agalactiae: NOT DETECTED
Vancomycin resistance: NOT DETECTED

## 2015-12-13 NOTE — Telephone Encounter (Signed)
Positive blood culture noted last evening, presumed contaminant based on 1/2 bottles as well as clinical appearance.  Of note, the blood culture by ID panel was negative for staph aureus. I discussed the interpretation of this panel and blood culture with Dr. Cleone Slimob: Infectious disease who concurred this should be interpreted as a contaminant, likely coag negative staph.  I have left a message in my cell phone number with [place] to discuss this with the nurse caring for the patient.  Brendia Sacksaniel Daley Gosse, MD [facility]

## 2015-12-14 LAB — CULTURE, BLOOD (ROUTINE X 2)

## 2015-12-16 LAB — CULTURE, BLOOD (ROUTINE X 2): CULTURE: NO GROWTH

## 2016-12-26 IMAGING — CT CT ABD-PELV W/ CM
2 of 5 series · 16 of 46 positions shown, 18 images · IV contrast (Omnipaque 300)
Comparison: None.

CLINICAL DATA: Hematuria.  Dementia.

EXAM:
CT ABDOMEN AND PELVIS WITH CONTRAST
TECHNIQUE: Multidetector CT imaging of the abdomen and pelvis was performed
using the standard protocol following bolus administration of
intravenous contrast.
CONTRAST:  75mL [id number] IOPAMIDOL (B8KXLN-9VV) INJECTION 61%

[Series 2: abd_pel_with 5.0 b40f · axial · 0.71mm/px · z∈[-444,+2]mm · 13 of 101 slices shown, 15 images]
[im 6/101  soft-tissue]
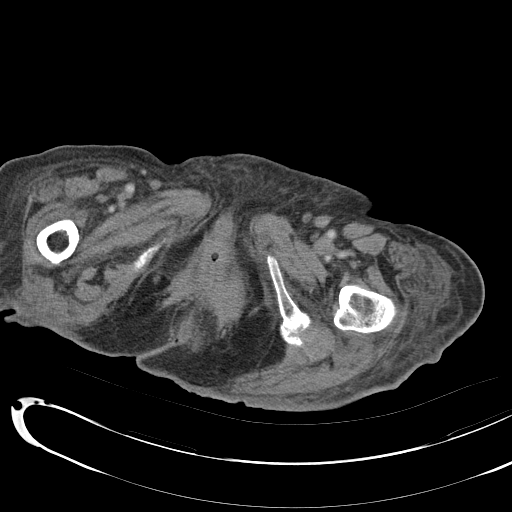
[im 6/101  bone]
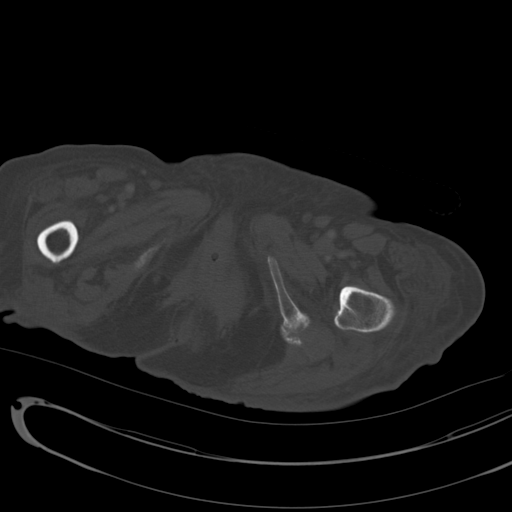
[im 12/101  soft-tissue]
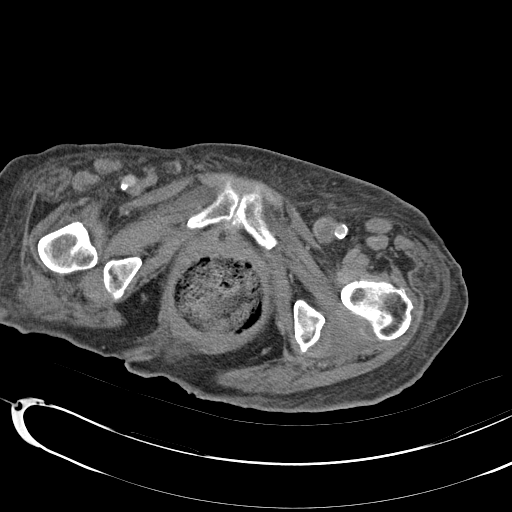
[im 23/101  soft-tissue]
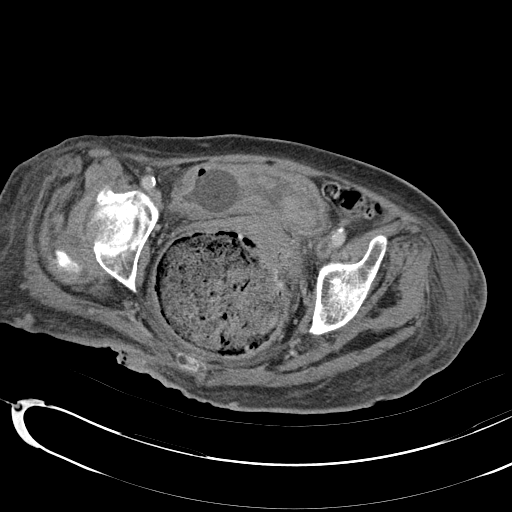
[im 28/101  soft-tissue]
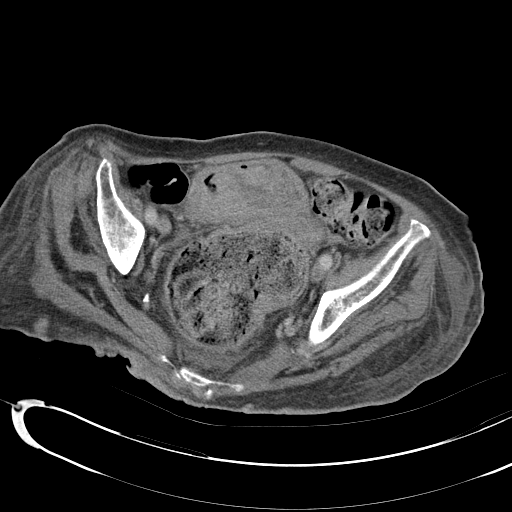
[im 34/101  soft-tissue]
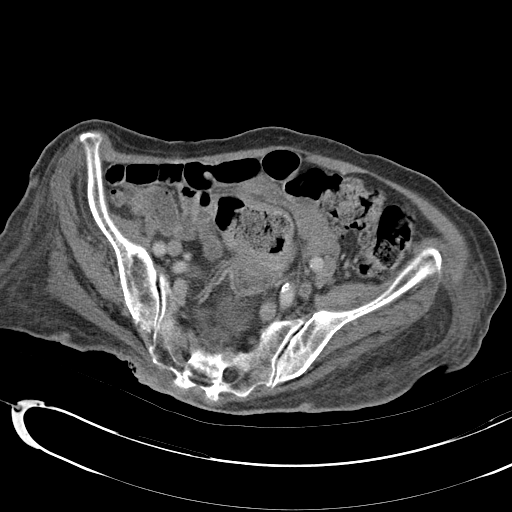
[im 45/101  soft-tissue]
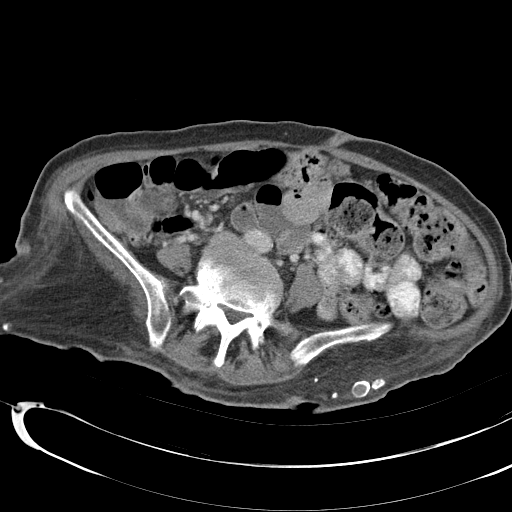
[im 51/101  soft-tissue]
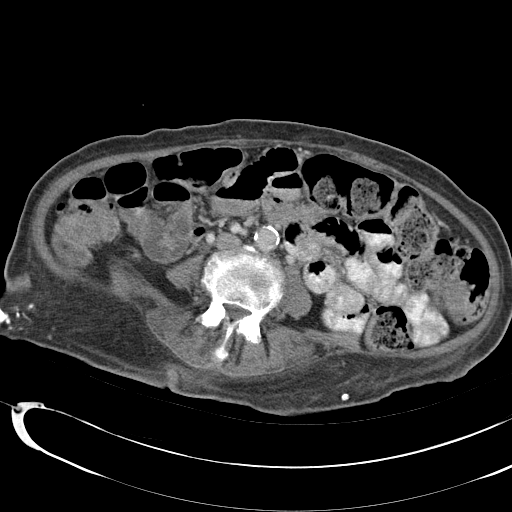
[im 56/101  soft-tissue]
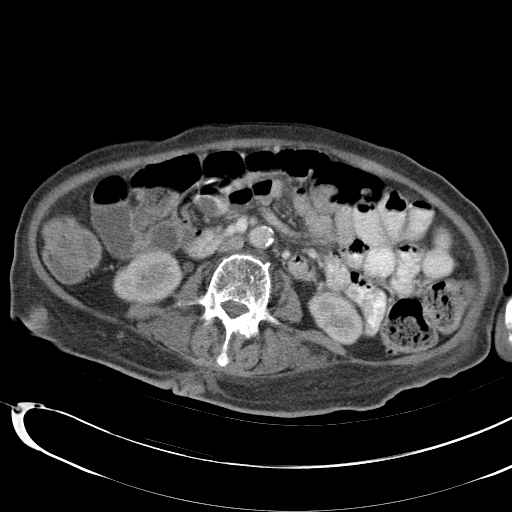
[im 67/101  soft-tissue]
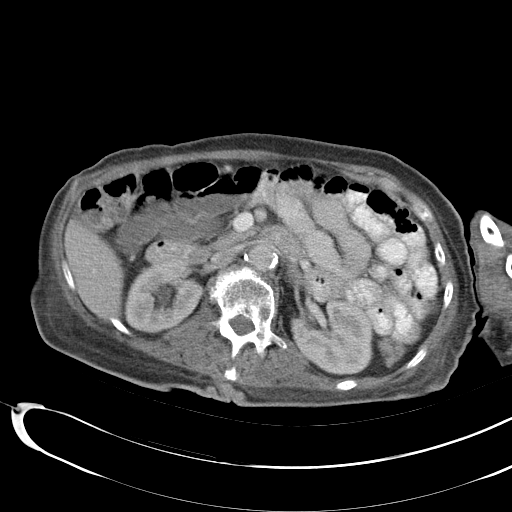
[im 67/101  bone]
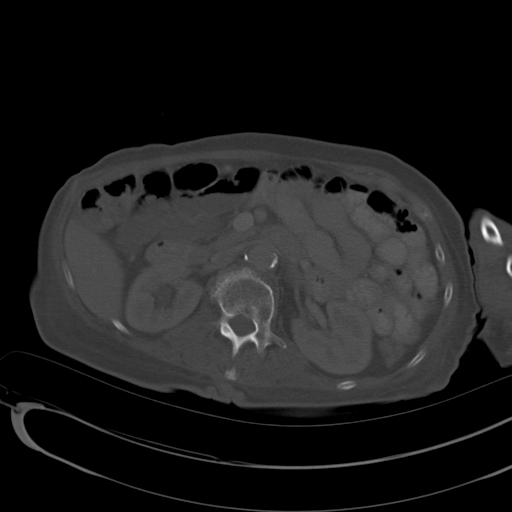
[im 73/101  soft-tissue]
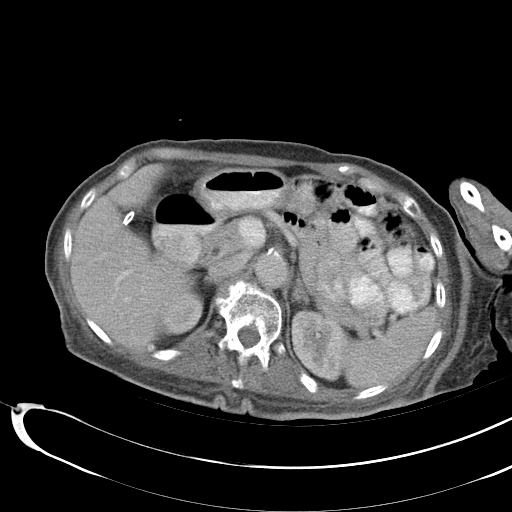
[im 78/101  soft-tissue]
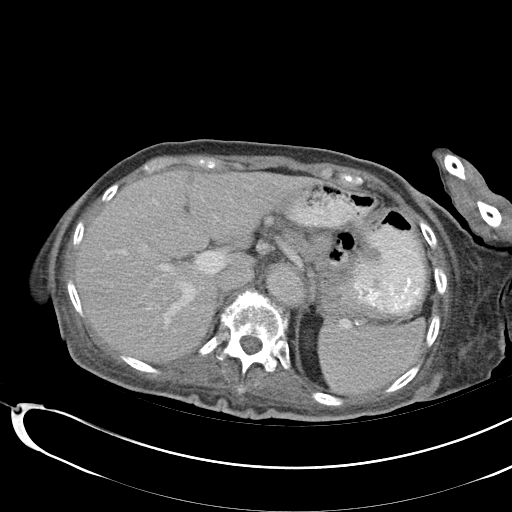
[im 89/101  soft-tissue]
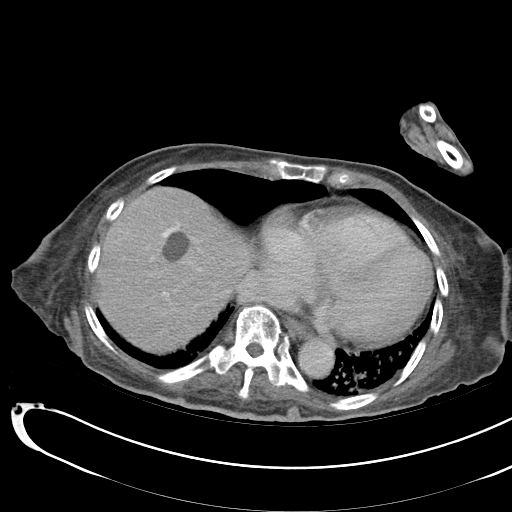
[im 95/101  soft-tissue]
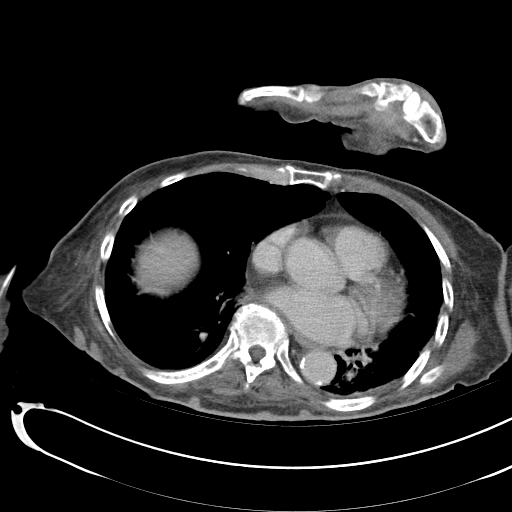

[Series 4: abd_pel_with 3.0 spo · coronal · 0.75mm/px · 3 of 72 slices shown]
[im 24/72  soft-tissue]
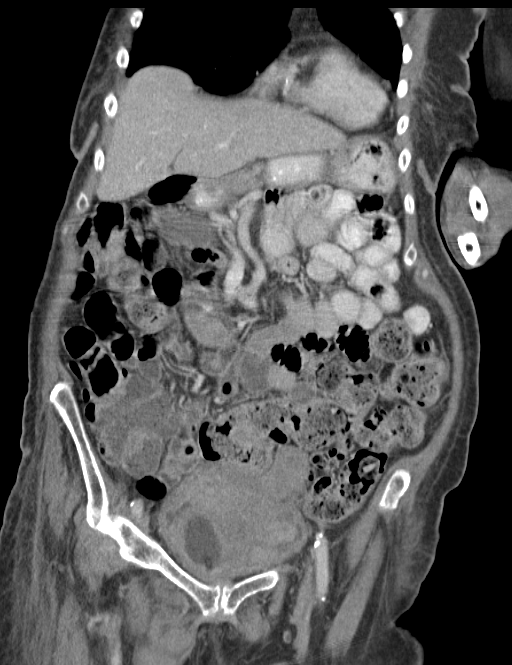
[im 32/72  soft-tissue]
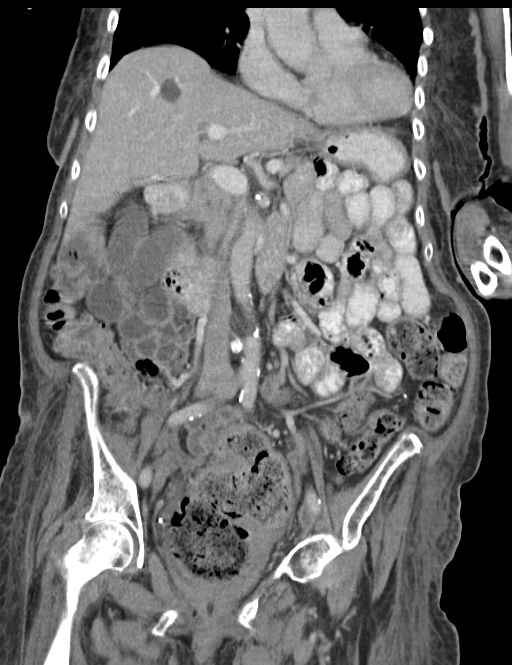
[im 40/72  soft-tissue]
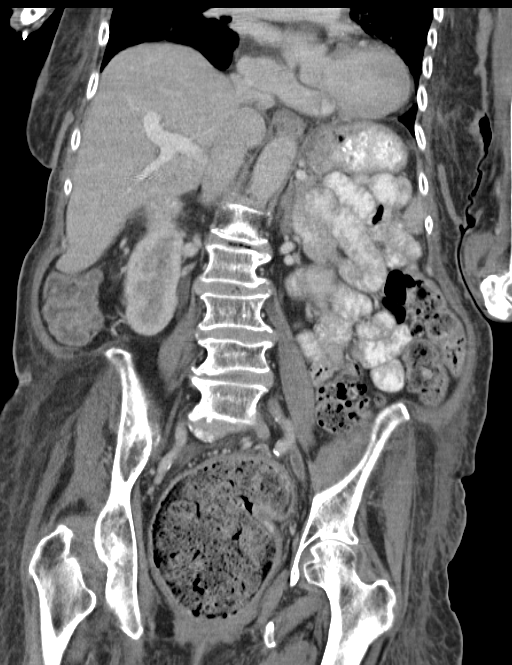

[16 of 46 positions shown; findings below may reference images not displayed]

FINDINGS: Lower chest and abdominal wall:  Atelectasis in the lower lobes.

Hepatobiliary: Upper right liver cyst with simple
appearance.Cholecystectomy with normal common bile duct.

Pancreas: Unremarkable.

Spleen: Unremarkable.

Adrenals/Urinary Tract: Negative adrenals. Fullness of the bilateral
urinary collecting system which is likely from poor bladder
drainage. Left hilar calcification appears vascular on reformats. No
urolithiasis suspected. Simple right renal cyst. Bladder is
partially decompressed by a well-positioned Foley catheter. Lumen
contains heterogeneous high density material consistent with clot.
Diffuse bladder wall thickening without gross bladder wall mass,
although limited by the degree of hematoma. No enhancement seen
along the upper urothelium.

Reproductive:Probable atrophic uterus which is deviated to the left.

Stomach/Bowel: The rectum is distended by stool to 10 cm. There is
congestion in the mesial rectal fat and mild wall thickening. Large
stool burden in the left colon. No small bowel obstruction. Negative
appendix.

Vascular/Lymphatic: Although early in the venous phase there is
asymmetric low-density appearance in the left common and external
iliac artery, patchy in the common femoral artery. There is
asymmetric left lower extremity edema seen at the thigh. Findings
are convincing for DVT, chronicity indeterminate due to lack of
vessel expansion. No mass or adenopathy.

Peritoneal: No ascites or pneumoperitoneum.

Musculoskeletal: No acute abnormalities.
IMPRESSION: 1. Moderate volume blood clot in the otherwise decompressed bladder.
Nonspecific bladder wall thickening without gross mass lesion. No
upper tract source of hematuria identified.
2. Rectal impaction with developing stercoral colitis.
3. Left iliac DVT.

## 2018-04-07 IMAGING — US US EXTREM LOW VENOUS BILAT
1 series · 13 of 24 positions shown · non-contrast
Comparison: 12/08/2015

CLINICAL DATA: Left iliac DVT by CT 12/08/2015



[Series 1: us extrem low venous bilat · 0.07mm/px · 13 of 61 slices shown]
[im 1/61]
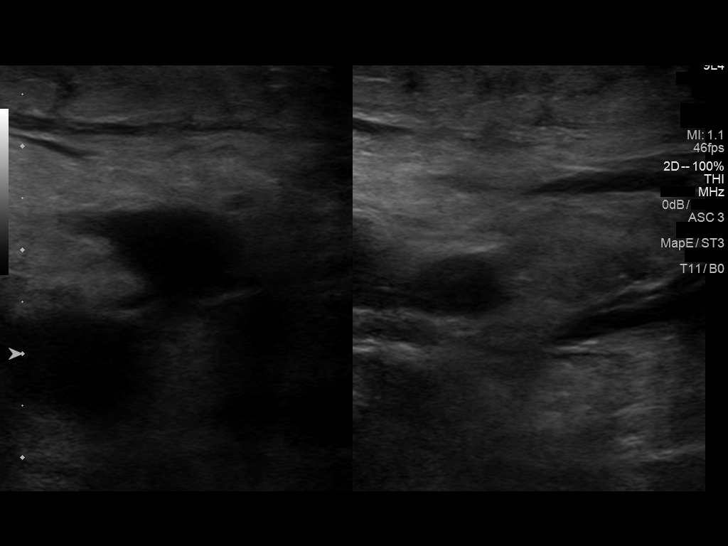
[im 6/61]
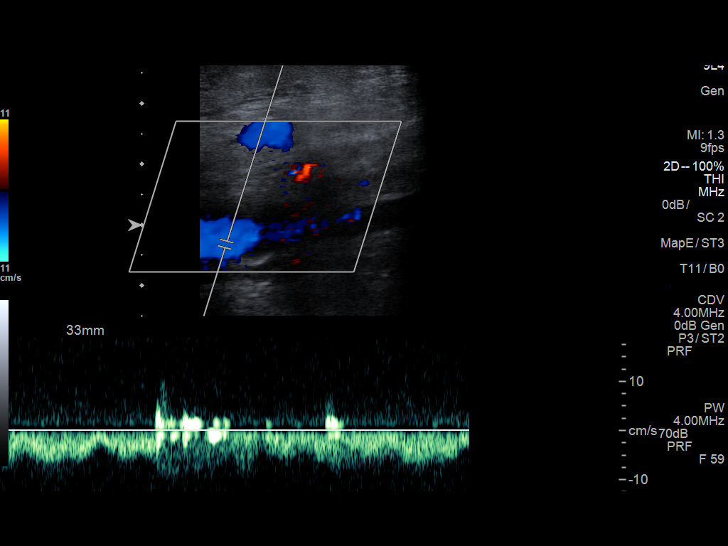
[im 11/61]
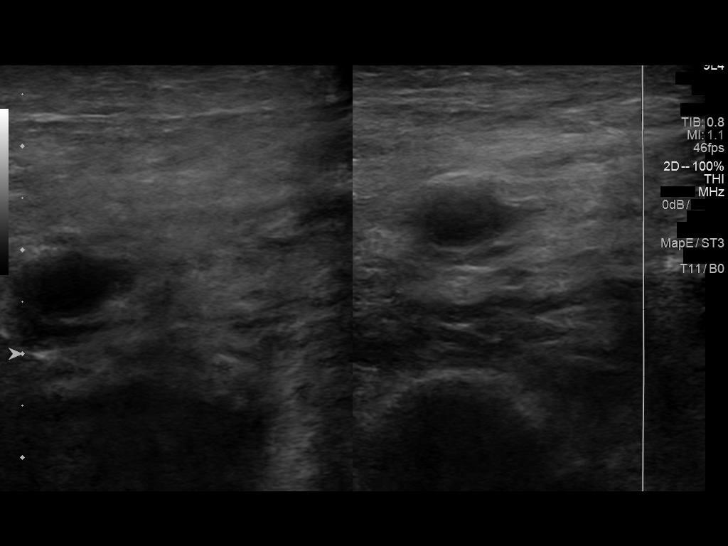
[im 16/61]
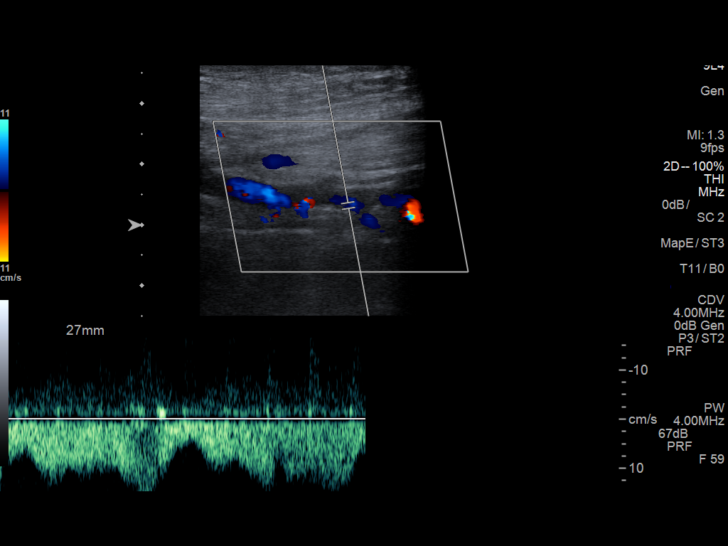
[im 21/61]
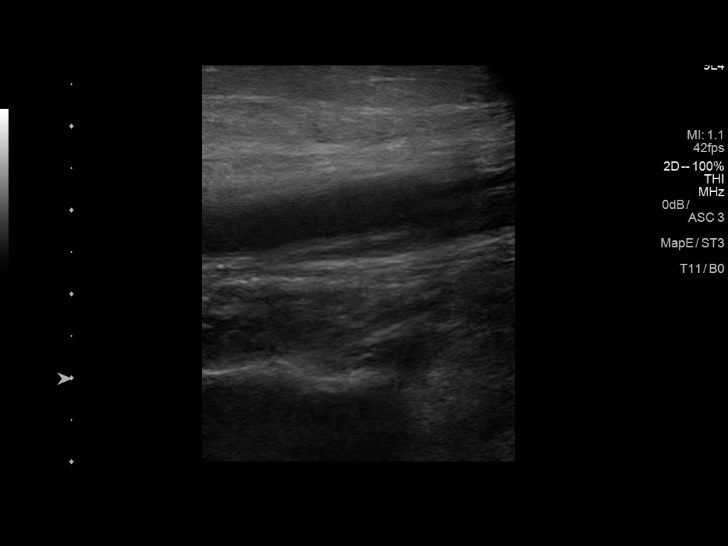
[im 27/61]
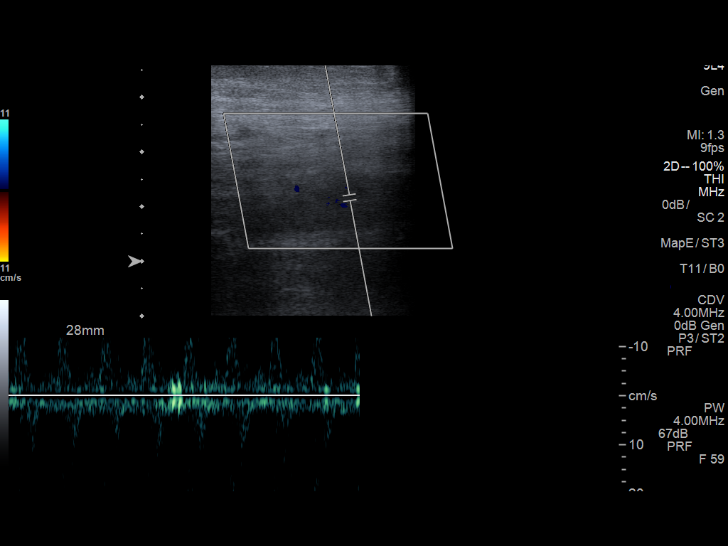
[im 32/61]
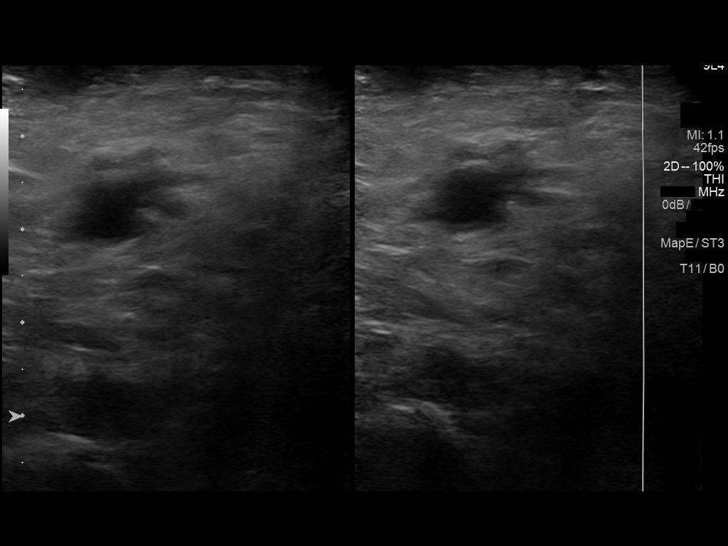
[im 34/61]
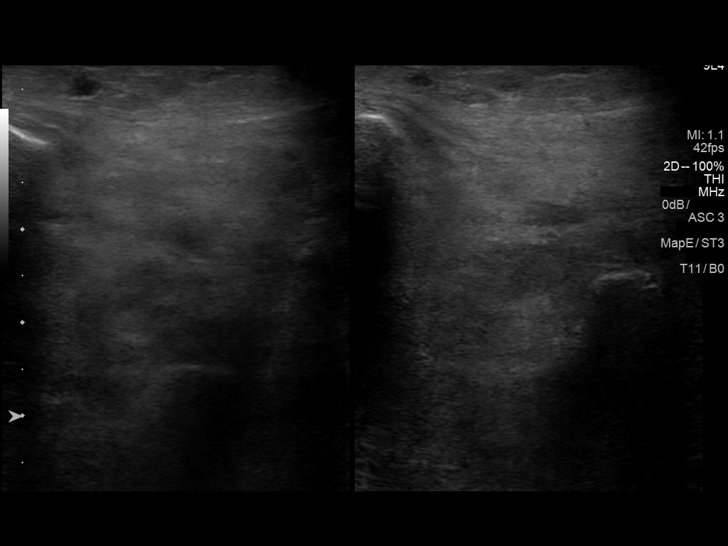
[im 40/61]
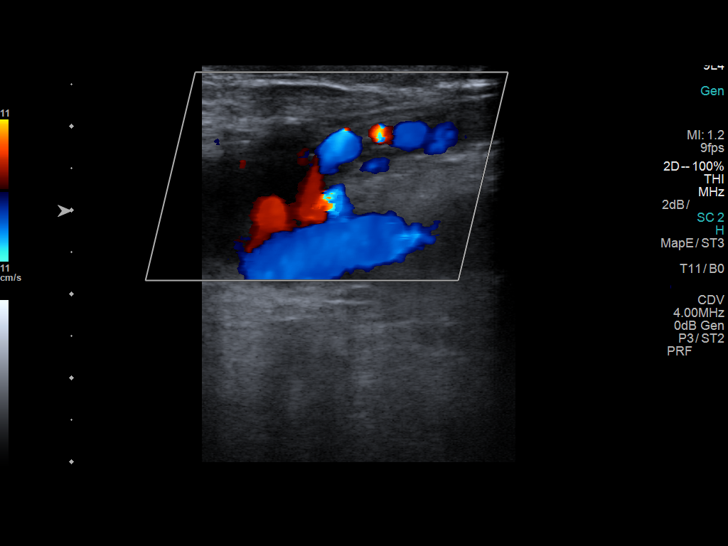
[im 45/61]
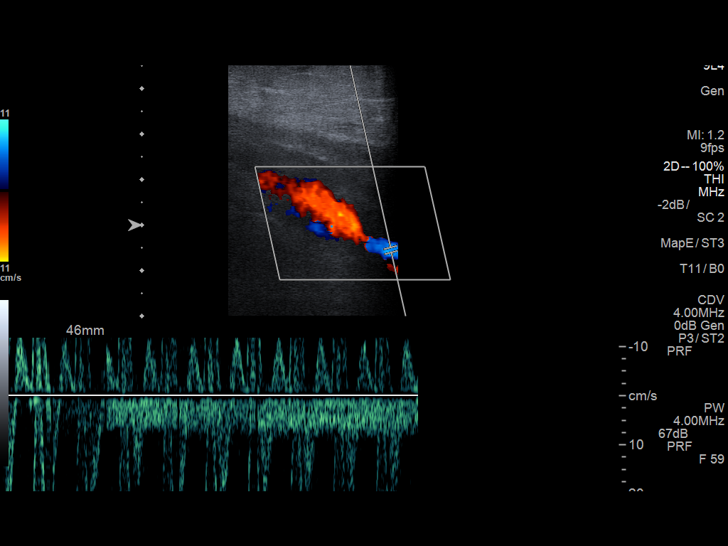
[im 50/61]
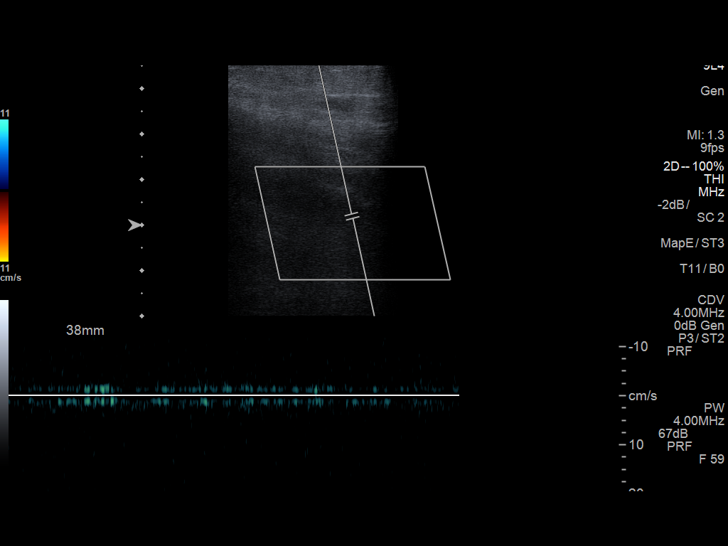
[im 55/61]
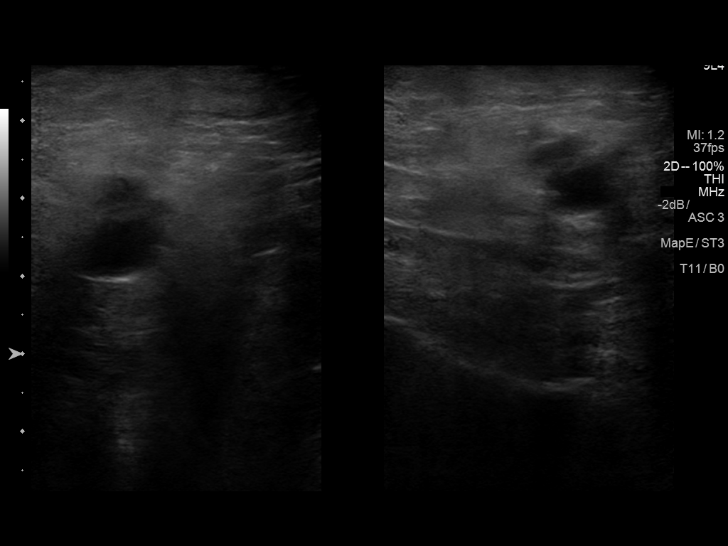
[im 61/61]
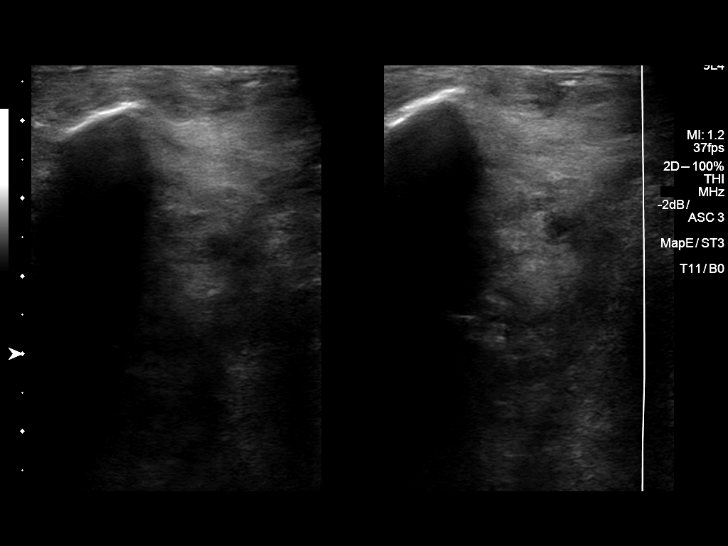

[13 of 24 positions shown; findings below may reference images not displayed]

FINDINGS: RIGHT LOWER EXTREMITY

Common Femoral Vein: No evidence of thrombus. Normal
compressibility, respiratory phasicity and response to augmentation.

Saphenofemoral Junction: No evidence of thrombus. Normal
compressibility and flow on color Doppler imaging.

Profunda Femoral Vein: No evidence of thrombus. Normal
compressibility and flow on color Doppler imaging.

Femoral Vein: No evidence of thrombus. Normal compressibility,
respiratory phasicity and response to augmentation.

Popliteal Vein: No evidence of thrombus. Normal compressibility,
respiratory phasicity and response to augmentation.

Calf Veins: Very limited assessment of the tibial and peroneal veins
because of peripheral edema.

Superficial Great Saphenous Vein: No evidence of thrombus. Normal
compressibility and flow on color Doppler imaging.

Venous Reflux:  None.

Other Findings:  None.

LEFT LOWER EXTREMITY

Hypoechoic intraluminal thrombus within the left common femoral
vein, extending into the saphenous femoral junction, and also within
the left femoral vein. Components of the left femoral DVT appear
nearly occlusive.

Popliteal vein remains patent. Tibial veins not well assessed
because of peripheral edema.

Other Findings:  None.
IMPRESSION: Positive exam for left lower extremity DVT within the left common
femoral and femoral veins with some extension into the saphenous
femoral junction. Femoral DVT appears nearly occlusive. Thrombus
does not propagate into the left popliteal vein.

Negative for right lower extremity DVT.

## 2018-05-30 ENCOUNTER — Encounter (HOSPITAL_BASED_OUTPATIENT_CLINIC_OR_DEPARTMENT_OTHER): Payer: Medicare Other | Attending: Internal Medicine

## 2018-05-30 DIAGNOSIS — Z86718 Personal history of other venous thrombosis and embolism: Secondary | ICD-10-CM | POA: Diagnosis not present

## 2018-05-30 DIAGNOSIS — F0280 Dementia in other diseases classified elsewhere without behavioral disturbance: Secondary | ICD-10-CM | POA: Diagnosis not present

## 2018-05-30 DIAGNOSIS — E1151 Type 2 diabetes mellitus with diabetic peripheral angiopathy without gangrene: Secondary | ICD-10-CM | POA: Insufficient documentation

## 2018-05-30 DIAGNOSIS — J449 Chronic obstructive pulmonary disease, unspecified: Secondary | ICD-10-CM | POA: Diagnosis not present

## 2018-05-30 DIAGNOSIS — L89154 Pressure ulcer of sacral region, stage 4: Secondary | ICD-10-CM | POA: Diagnosis not present

## 2018-06-19 DIAGNOSIS — L89154 Pressure ulcer of sacral region, stage 4: Secondary | ICD-10-CM | POA: Diagnosis not present

## 2018-07-11 ENCOUNTER — Other Ambulatory Visit (HOSPITAL_COMMUNITY)
Admission: RE | Admit: 2018-07-11 | Discharge: 2018-07-11 | Disposition: A | Discharge status: Home / Self Care | Payer: Medicare Other | Source: Other Acute Inpatient Hospital | Attending: Internal Medicine | Admitting: Internal Medicine

## 2018-07-11 ENCOUNTER — Encounter (HOSPITAL_BASED_OUTPATIENT_CLINIC_OR_DEPARTMENT_OTHER): Payer: Medicare Other | Attending: Internal Medicine

## 2018-07-11 DIAGNOSIS — E119 Type 2 diabetes mellitus without complications: Secondary | ICD-10-CM | POA: Diagnosis not present

## 2018-07-11 DIAGNOSIS — Z86718 Personal history of other venous thrombosis and embolism: Secondary | ICD-10-CM | POA: Diagnosis not present

## 2018-07-11 DIAGNOSIS — J449 Chronic obstructive pulmonary disease, unspecified: Secondary | ICD-10-CM | POA: Diagnosis not present

## 2018-07-11 DIAGNOSIS — L89154 Pressure ulcer of sacral region, stage 4: Secondary | ICD-10-CM | POA: Insufficient documentation

## 2018-07-11 DIAGNOSIS — F0280 Dementia in other diseases classified elsewhere without behavioral disturbance: Secondary | ICD-10-CM | POA: Insufficient documentation

## 2018-07-14 LAB — AEROBIC CULTURE W GRAM STAIN (SUPERFICIAL SPECIMEN): Gram Stain: NONE SEEN

## 2018-07-14 LAB — AEROBIC CULTURE  (SUPERFICIAL SPECIMEN)

## 2018-08-01 ENCOUNTER — Encounter (HOSPITAL_BASED_OUTPATIENT_CLINIC_OR_DEPARTMENT_OTHER): Payer: Medicare Other | Attending: Internal Medicine

## 2018-08-01 DIAGNOSIS — J449 Chronic obstructive pulmonary disease, unspecified: Secondary | ICD-10-CM | POA: Insufficient documentation

## 2018-08-01 DIAGNOSIS — Z86718 Personal history of other venous thrombosis and embolism: Secondary | ICD-10-CM | POA: Diagnosis not present

## 2018-08-01 DIAGNOSIS — F0390 Unspecified dementia without behavioral disturbance: Secondary | ICD-10-CM | POA: Diagnosis not present

## 2018-08-01 DIAGNOSIS — L89154 Pressure ulcer of sacral region, stage 4: Secondary | ICD-10-CM | POA: Diagnosis not present

## 2018-08-01 DIAGNOSIS — E119 Type 2 diabetes mellitus without complications: Secondary | ICD-10-CM | POA: Insufficient documentation

## 2018-08-30 DEATH — deceased
